# Patient Record
Sex: Male | Born: 1965 | Race: White | Hispanic: No | Marital: Married | Smoking: Never smoker
Health system: Southern US, Community
[De-identification: ages and names within clinical notes are randomized; demographics above are authoritative.]

## PROBLEM LIST (undated history)

## (undated) DIAGNOSIS — F419 Anxiety disorder, unspecified: Secondary | ICD-10-CM

## (undated) DIAGNOSIS — F32A Depression, unspecified: Secondary | ICD-10-CM

## (undated) DIAGNOSIS — G459 Transient cerebral ischemic attack, unspecified: Secondary | ICD-10-CM

## (undated) DIAGNOSIS — F524 Premature ejaculation: Secondary | ICD-10-CM

## (undated) DIAGNOSIS — K219 Gastro-esophageal reflux disease without esophagitis: Secondary | ICD-10-CM

## (undated) DIAGNOSIS — E291 Testicular hypofunction: Secondary | ICD-10-CM

## (undated) DIAGNOSIS — N529 Male erectile dysfunction, unspecified: Secondary | ICD-10-CM

## (undated) DIAGNOSIS — I639 Cerebral infarction, unspecified: Secondary | ICD-10-CM

## (undated) DIAGNOSIS — F329 Major depressive disorder, single episode, unspecified: Secondary | ICD-10-CM

## (undated) HISTORY — DX: Major depressive disorder, single episode, unspecified: F32.9

## (undated) HISTORY — DX: Male erectile dysfunction, unspecified: N52.9

## (undated) HISTORY — DX: Anxiety disorder, unspecified: F41.9

## (undated) HISTORY — DX: Depression, unspecified: F32.A

## (undated) HISTORY — DX: Premature ejaculation: F52.4

## (undated) HISTORY — DX: Testicular hypofunction: E29.1

## (undated) HISTORY — DX: Cerebral infarction, unspecified: I63.9

## (undated) HISTORY — DX: Gastro-esophageal reflux disease without esophagitis: K21.9

## (undated) HISTORY — PX: VASECTOMY: SHX75

---

## 2002-03-19 ENCOUNTER — Encounter: Payer: Self-pay | Admitting: Emergency Medicine

## 2002-03-19 ENCOUNTER — Emergency Department (HOSPITAL_COMMUNITY): Admission: EM | Admit: 2002-03-19 | Discharge: 2002-03-19 | Payer: Self-pay | Admitting: Emergency Medicine

## 2002-09-08 ENCOUNTER — Encounter: Admission: RE | Admit: 2002-09-08 | Discharge: 2002-09-08 | Payer: Self-pay | Admitting: Emergency Medicine

## 2002-09-08 ENCOUNTER — Encounter: Payer: Self-pay | Admitting: Emergency Medicine

## 2005-02-10 ENCOUNTER — Encounter: Admission: RE | Admit: 2005-02-10 | Discharge: 2005-02-10 | Payer: Self-pay | Admitting: Emergency Medicine

## 2006-03-13 DIAGNOSIS — I639 Cerebral infarction, unspecified: Secondary | ICD-10-CM

## 2006-03-13 HISTORY — DX: Cerebral infarction, unspecified: I63.9

## 2006-10-11 ENCOUNTER — Inpatient Hospital Stay (HOSPITAL_COMMUNITY): Admission: EM | Admit: 2006-10-11 | Discharge: 2006-10-12 | Payer: Self-pay | Admitting: Emergency Medicine

## 2006-10-12 ENCOUNTER — Ambulatory Visit: Payer: Self-pay | Admitting: Vascular Surgery

## 2006-10-12 ENCOUNTER — Encounter (INDEPENDENT_AMBULATORY_CARE_PROVIDER_SITE_OTHER): Payer: Self-pay | Admitting: Internal Medicine

## 2007-03-13 ENCOUNTER — Ambulatory Visit (HOSPITAL_COMMUNITY): Admission: RE | Admit: 2007-03-13 | Discharge: 2007-03-13 | Payer: Self-pay | Admitting: Cardiovascular Disease

## 2007-03-13 ENCOUNTER — Ambulatory Visit: Payer: Self-pay | Admitting: Cardiovascular Disease

## 2009-07-09 ENCOUNTER — Emergency Department (HOSPITAL_COMMUNITY): Admission: EM | Admit: 2009-07-09 | Discharge: 2009-07-09 | Payer: Self-pay | Admitting: Emergency Medicine

## 2009-09-28 ENCOUNTER — Ambulatory Visit (HOSPITAL_COMMUNITY): Admission: RE | Admit: 2009-09-28 | Discharge: 2009-09-28 | Payer: Self-pay | Admitting: Gastroenterology

## 2010-07-26 NOTE — H&P (Signed)
NAMECAGE, GUPTON                 ACCOUNT NO.:  [id number]   MEDICAL RECORD NO.:  [id number]          PATIENT TYPE:  INP   LOCATION:  6703                         FACILITY:  [facility]   PHYSICIAN:  Lonia Blood, M.D.       DATE OF BIRTH:  05/17/1965   DATE OF ADMISSION:  10/11/2006  DATE OF DISCHARGE:                              HISTORY & PHYSICAL   PRIMARY CARE PHYSICIAN:  Dr. Leslee Home   CHIEF COMPLAINT:  Could not talk.   HISTORY OF PRESENT ILLNESS:  Mr. Mcadory is a 45 year old gentleman  without any significant past medical history who presents to [facility]  emergency room via ambulance after he had an acute onset episode of  difficulties understanding words and then speaking them properly.  He  was also examined by his wife who is a nurse who felt that the patient  was having right facial droop.  The patient denies any focal weakness,  but he reports some right upper extremity tingling and some left leg  tingling.  The patient denies any vertigo.  The patient denies any  double vision.  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  The patient has no prior episodes similar to  this.   PAST MEDICAL HISTORY:  1. Gastroesophageal reflux disease.  2. Hiatal hernia.   HOME MEDICATIONS:  Zegerid 40 mg daily.   SOCIAL HISTORY:  The patient is married, has two twins, does not smoke  cigarettes.  Does not drink alcohol.  Does not use drugs.  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   FAMILY HISTORY:  The patient's mother is alive age 11, healthy.  The  patient's father is age 63, healthy, alive.  Grandmother had a stroke in  her 59s.  He has a sister who is alive and healthy.   REVIEW OF SYSTEMS:  As per HPI.  The patient denies any chest pain.  Denies nausea, vomiting, abdominal pain.  Denies fever, chills.  Denies  photophobia, phonophobia.  Reports that he was found to have traces of  blood in his stool, and he is in process getting endoscopy/colonoscopy.  Also, the patient reports some mild headache because he has not eaten  the whole day.   PHYSICAL EXAMINATION UPON ADMISSION:  VITAL SIGNS:  Temperature 97.7,  blood pressure 139/76, pulse 66, respirations 20, saturating 98% on room  air.  GENERAL APPEARANCE:  Well-developed, muscular gentleman in no acute  distress, sitting on stretcher.  Alert and oriented to place, person and  time.  HEENT:  Normocephalic, atraumatic.  Eyes pupils equal, round react and  accommodation.  Extraocular movements intact.  Throat clear.  Conjunctivae pink.  Sclerae anicteric.  NECK: Supple.  No JVD, carotid bruits.  CHEST: Clear to auscultation without wheeze, crackles.  HEART:  Regular rate and rhythm without murmurs, rubs or gallop.  ABDOMEN:  Soft, nontender, nondistended, bowel sounds are present.  EXTREMITIES:  Lower Extremities have no edema.  SKIN:  Warm, dry without any suspicious rashes.  NEUROLOGICAL:  Cranial nerves II-XII intact.  Cerebellar test is intact.  Deep tendon reflexes are +1 symmetric.  Sensation is intact all four  extremities.  Strength 5/5 in all four extremities.  Repetition is  intact.  Speech is intact.  Recall of three object is intact.  The  patient knows the president, vice president, knows date and time,  location.   LABORATORY VALUES ON ADMISSION:  Head CT is within normal limits without  any signs of bleed.  CBC shows white blood cell count 10,000, hemoglobin  13, MCV 76 and platelet 272.  PT is 14, INR is 1.1, PTT 28.  Chest x-ray  shows no acute disease.   ASSESSMENT/PLAN:  Transient ischemic attack.  The patient's symptoms are  highly worrisome for a transient ischemic attack.  Unsure about the  exact etiology of this transient ischemic attack, but it is sure seems  that neurological deficits have resolved completely.  The patient has  been working in the extremely hot weather, so I will not be surprised if  he has a degree dehydration. My plan is to admit Mr.  Holtsclaw in the  hospital and do a full evaluation including MRI of the brain, MRA of the  cranial vessels as well as the neck vessels.  The patient will have a  transthoracic echocardiogram.  The patient will have neurological checks  frequently.  We will start him on aspirin for now and place him on  intravenous fluids.  The basic metabolic profile will be checked as  well.      Lonia Blood, M.D.  Electronically Signed     SL/MEDQ  D:  10/11/2006  T:  10/12/2006  Job:  [id number]   cc:   Reuben Likes, M.D.

## 2010-07-26 NOTE — Assessment & Plan Note (Signed)
[facility]   NAME:  Zachary Massey, Zachary Massey                 ACCOUNT NO.:  [id number]   MEDICAL RECORD NO.:  [id number]      DATE OF BIRTH:  [birth year]   PHYSICIAN:  Noralyn Pick. Eden Emms, MD, [facility]    VISIT DATE:                                   OFFICE VISIT   PROCEDURE:  TEE.   INDICATIONS:  A 45 year old patient with question TIA.   PROCEDURE:  The patient was sedated with 100 mcg of fentanyl and 6 mg of  Versed using digital technique and Omniplane probes advanced in the  distal esophagus without incident. Transgastric imaging revealed normal  left ventricular cavity size and function. The EF with 60%.  There was  no mural thrombus. Mitral aortic and tricuspid valves were structurally  normal. The left atrium and right-sided cardiac chambers were normal.  There was no ASD or VSD. There was a prominent eustachian valve. Bubble  study was negative for right-to-left shunt. Interrogation of the atrial  septum by color and 2-D showed no ASD. Imaging of the left atrial  appendage was normal with no spontaneous contrast or thrombus. Imaging  of the aorta showed no debris.   FINAL IMPRESSION:  1. Normal transesophageal echocardiogram.  2. No source of embolus.  3. Negative bubble study with no right-to-left shunt.  4. Normal cardiac valves.  5. Normal left jugular function, EF 60%.   The patient tolerated the procedure well.      Noralyn Pick. Eden Emms, MD, [facility]  Electronically Signed     PCN/MEDQ  D:  03/13/2007  T:  03/13/2007  Job:  [id number]   cc:   Pramod P. Pearlean Brownie, MD

## 2010-07-29 NOTE — Discharge Summary (Signed)
NAMESIMUEL, Zachary Massey                 ACCOUNT NO.:  [id number]   MEDICAL RECORD NO.:  [id number]          PATIENT TYPE:  INP   LOCATION:  6703                         FACILITY:  [facility]   PHYSICIAN:  Lonia Blood, M.D.       DATE OF BIRTH:  [birth year]   DATE OF ADMISSION:  10/11/2006  DATE OF DISCHARGE:  10/12/2006                               DISCHARGE SUMMARY   THE PATIENT'S PRIMARY CARE PHYSICIAN:  Reuben Likes, M.D.   DISCHARGE DIAGNOSES:  1. Transient ischemic attack.  2. Dehydration, resolved.  3. Hypertriglyceridemia.  4. Gastroesophageal reflux disease and hiatal hernia.   DISCHARGE MEDICATIONS:  1. Aspirin 325 mg daily.  2. Zegerid twice a day.  3. Lovaza four capsules by mouth daily.   CONDITION ON DISCHARGE:  [name] was discharged in good condition.  At the time of discharge the patient was instructed to follow up with  his primary care physician, Dr. Leslee Home, as needed.  At the time of  the discharge the patient was neurologically intact.   PROCEDURES DURING THIS ADMISSION:  1. October 11, 2006, a computerized tomography scan of the head.      Findings:  Within normal limits.  2. October 11, 2006, MRI of the brain.  Findings:  Tiny nonspecific      cerebrospinal fluid intensity in the left periatrial white matter      of questionable significance.  No acute infarct or abnormal      intracranial enhancing lesions.  3. MRA of the head.  Findings of slight irregularity of medium-sized      vessels and branch vessels, which may represent limitation of the      present exam.  4. MRA of the neck:  Findings of no hemodynamically significant      stenosis.  5. October 12, 2006, carotid ultrasound.  Findings of no significant ICA      stenosis.  6. Transthoracic echocardiogram.  Findings of ejection fraction of 55-      65%, no diastolic dysfunction.  No clear PFO.   CONSULTATION:  No consultations were obtained.   HISTORY AND PHYSICAL:  Please refer to a dictated H&P  done by Dr. Lavera Guise  on October 11, 2006.   HOSPITAL COURSE:  Problem:  TRANSIENT ISCHEMIC ATTACKS.  [name] was admitted after he  had experienced transient global aphasia.  His episode was in the  setting of prolonged physical effort under extreme heat.  Given the  worrisome neurological history [name] was admitted for a complete  workup, which included MRI of the brain, MRA of the cranial vessels and  of the neck as well as a transthoracic echocardiogram.  All these  studies were essentially normal and did not show any findings that could  explain the patient's episode.  He also received generous intravenous  fluids and he did not have any recurrent events.  He was advised to  start taking aspirin.  The patient's  fasting lipid panel was measured and his triglycerides were measured at  396.  The patient was educated about the proper  diet, which includes  avoidance of highly-refined carbohydrates, proper exercise, and he was  started on Lovaza.      Lonia Blood, M.D.  Electronically Signed     SL/MEDQ  D:  10/14/2006  T:  10/15/2006  Job:  [id number]

## 2010-12-26 LAB — COMPREHENSIVE METABOLIC PANEL
ALT: 14
Alkaline Phosphatase: 81
BUN: 8
CO2: 26
Chloride: 103
GFR calc non Af Amer: >60
Glucose, Bld: 83
Potassium: 4.5
Sodium: 136
Total Bilirubin: 0.9
Total Protein: 6.9

## 2010-12-26 LAB — HOMOCYSTEINE: Homocysteine: 6.2

## 2010-12-26 LAB — DIFFERENTIAL
Basophils Absolute: 0
Basophils Absolute: 0.1
Basophils Relative: 0
Basophils Relative: 1
Eosinophils Absolute: 0.1
Eosinophils Absolute: 0.1
Eosinophils Relative: 1
Eosinophils Relative: 1
Lymphocytes Relative: 27
Lymphocytes Relative: 35
Lymphs Abs: 2.7
Lymphs Abs: 2.7
Monocytes Absolute: 0.5
Monocytes Absolute: 0.5
Monocytes Relative: 5
Monocytes Relative: 7
Neutro Abs: 4.5
Neutro Abs: 6.8
Neutrophils Relative %: 57
Neutrophils Relative %: 68

## 2010-12-26 LAB — ETHANOL: Alcohol, Ethyl (B): <5

## 2010-12-26 LAB — RAPID URINE DRUG SCREEN, HOSP PERFORMED
Amphetamines: NOT DETECTED
Barbiturates: NOT DETECTED
Benzodiazepines: NOT DETECTED
Cocaine: NOT DETECTED
Opiates: NOT DETECTED
Tetrahydrocannabinol: NOT DETECTED

## 2010-12-26 LAB — CBC
HCT: 37 — ABNORMAL LOW
HCT: 40.3
Hemoglobin: 11.9 — ABNORMAL LOW
Hemoglobin: 13.4
MCHC: 32.1
MCHC: 33.2
MCV: 76 — ABNORMAL LOW
MCV: 83
Platelets: 237
Platelets: 272
RBC: 4.46
RBC: 5.3
RDW: 14.1 — ABNORMAL HIGH
RDW: 14.5 — ABNORMAL HIGH
WBC: 10.1
WBC: 7.8

## 2010-12-26 LAB — LIPID PANEL
Cholesterol: 167
LDL Cholesterol: 63

## 2010-12-26 LAB — PROTIME-INR
INR: 0.9
INR: 1.1
Prothrombin Time: 12
Prothrombin Time: 14.1

## 2010-12-26 LAB — COMPREHENSIVE METABOLIC PANEL WITH GFR
AST: 27
Albumin: 4
Calcium: 8.9
Creatinine, Ser: 0.93
GFR calc Af Amer: >60

## 2010-12-26 LAB — CK TOTAL AND CKMB (NOT AT ARMC)
CK, MB: 1.5
Relative Index: 1.1
Total CK: 135

## 2010-12-26 LAB — TROPONIN I: Troponin I: 0.02

## 2010-12-26 LAB — APTT
aPTT: 25
aPTT: 28

## 2011-05-15 ENCOUNTER — Other Ambulatory Visit: Payer: Self-pay | Admitting: Family Medicine

## 2011-05-15 MED ORDER — CLOPIDOGREL BISULFATE 75 MG PO TABS: 75.0000 mg | ORAL_TABLET | Freq: Every day | ORAL | Status: DC

## 2011-05-15 MED ORDER — SERTRALINE HCL 100 MG PO TABS: 100.0000 mg | ORAL_TABLET | Freq: Every day | ORAL | Status: DC

## 2011-05-25 ENCOUNTER — Ambulatory Visit (INDEPENDENT_AMBULATORY_CARE_PROVIDER_SITE_OTHER): Payer: 59 | Admitting: Family Medicine

## 2011-05-25 VITALS — BP 131/81 | HR 53 | Temp 98.7°F | Resp 16 | Ht 67.5 in | Wt 182.0 lb

## 2011-05-25 DIAGNOSIS — G459 Transient cerebral ischemic attack, unspecified: Secondary | ICD-10-CM

## 2011-05-25 DIAGNOSIS — F411 Generalized anxiety disorder: Secondary | ICD-10-CM

## 2011-05-25 DIAGNOSIS — F419 Anxiety disorder, unspecified: Secondary | ICD-10-CM

## 2011-05-25 DIAGNOSIS — K219 Gastro-esophageal reflux disease without esophagitis: Secondary | ICD-10-CM

## 2011-05-25 DIAGNOSIS — Z0000 Encounter for general adult medical examination without abnormal findings: Secondary | ICD-10-CM

## 2011-05-25 LAB — COMPREHENSIVE METABOLIC PANEL
Albumin: 4.7 g/dL (ref 3.5–5.2)
Alkaline Phosphatase: 88 U/L (ref 39–117)
BUN: 14 mg/dL (ref 6–23)
Creat: 0.84 mg/dL (ref 0.50–1.35)
Glucose, Bld: 94 mg/dL (ref 70–99)
Potassium: 4.4 mEq/L (ref 3.5–5.3)

## 2011-05-25 LAB — LIPID PANEL
HDL: 33 mg/dL — ABNORMAL LOW (ref >39)
LDL Cholesterol: 108 mg/dL — ABNORMAL HIGH (ref 0–99)
Total CHOL/HDL Ratio: 5.8 Ratio
Triglycerides: 258 mg/dL — ABNORMAL HIGH (ref <150)

## 2011-05-25 MED ORDER — CLOPIDOGREL BISULFATE 75 MG PO TABS: 75.0000 mg | ORAL_TABLET | Freq: Every day | ORAL | Status: DC

## 2011-05-25 MED ORDER — SERTRALINE HCL 100 MG PO TABS: 100.0000 mg | ORAL_TABLET | Freq: Every day | ORAL | Status: DC

## 2011-05-25 NOTE — Progress Notes (Signed)
Color Test: 6/6 Horizontal Field: R: 85  L: 85 Whisper Test: R: 47ft.  L:  30ft.  UA Dip: Sp Gr: 1.025 Protein: 0 Blood: 0 Sugar: 0

## 2011-05-25 NOTE — Progress Notes (Signed)
  Subjective:    Patient ID: Zachary Massey, male    DOB: [birth year], 46 y.o.   MRN: [id number]  HPI Zachary Massey is a 46 y.o. male  CPE with DOT pe.    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 Last eval year ago. No restrictions.   Hx GERD, with short segment Barrett's esophagus, followed by Dr Matthias Hughs, on Protonix QD. On zoloft for premature ejaculation, and hx of anxiety  And depression.  Denies current depression or SI.  Chiropodist, backup driver.  Review of Systems See 13 point ROS on scanned PHS - specifically no depression, anxiety well controlled on current dose of zoloft, no hematuria or other easy bruising.  No recent new neuro sx's - no weakness or slurred speech.      Objective:   Physical Exam  Constitutional: He is oriented to person, place, and time. He appears well-developed and well-nourished.  HENT:  Head: Normocephalic and atraumatic.  Right Ear: External ear normal.  Left Ear: External ear normal.  Nose: Nose normal.  Mouth/Throat: Oropharynx is clear and moist.  Eyes: Conjunctivae and EOM are normal. Pupils are equal, round, and reactive to light.  Neck: Normal range of motion. No thyromegaly present.  Cardiovascular: Normal rate, regular rhythm, normal heart sounds and intact distal pulses.   No murmur heard. Pulmonary/Chest: Effort normal and breath sounds normal.  Abdominal: Soft. Bowel sounds are normal. He exhibits no mass. There is no tenderness. Hernia confirmed negative in the right inguinal area and confirmed negative in the left inguinal area.  Genitourinary: Testes normal and penis normal.  Musculoskeletal: Normal range of motion. He exhibits no edema and no tenderness.  Lymphadenopathy:    He has no cervical adenopathy.  Neurological: He is alert and oriented to person, place, and time. He has normal strength. No sensory deficit. Coordination and gait normal.  Skin: Skin is warm and dry.    Psychiatric: He has a normal mood and affect. His speech is normal and behavior is normal. Judgment normal. Cognition and memory are normal.        Assessment & Plan:  Zachary Massey is a 46 y.o. male 1. Annual physical exam  Comprehensive metabolic panel, Lipid panel, CBC  2. GERD (gastroesophageal reflux disease)    3. TIA (transient ischemic attack)    4. Anxiety     No recent new neuro sx's.  Anxiety and premature ejaculation under control with current meds.  No new side effects with meds.  Vit D and other labs pending from GI.  check CBC, CMP, lipid panel, refilled Plavix and Zoloft for 6 months, then follow up with Dr. Perrin Maltese. 1 year DOT card.

## 2011-05-26 LAB — CBC
HCT: 44.2 % (ref 39.0–52.0)
MCHC: 32.6 g/dL (ref 30.0–36.0)
MCV: 76.7 fL — ABNORMAL LOW (ref 78.0–100.0)
RDW: 14.8 % (ref 11.5–15.5)

## 2011-12-01 ENCOUNTER — Encounter: Payer: Self-pay | Admitting: Family Medicine

## 2011-12-01 ENCOUNTER — Ambulatory Visit (INDEPENDENT_AMBULATORY_CARE_PROVIDER_SITE_OTHER): Payer: 59 | Admitting: Family Medicine

## 2011-12-01 VITALS — BP 124/83 | HR 75 | Temp 97.9°F | Resp 16 | Ht 68.5 in | Wt 192.8 lb

## 2011-12-01 DIAGNOSIS — R5383 Other fatigue: Secondary | ICD-10-CM

## 2011-12-01 DIAGNOSIS — Z8673 Personal history of transient ischemic attack (TIA), and cerebral infarction without residual deficits: Secondary | ICD-10-CM | POA: Insufficient documentation

## 2011-12-01 DIAGNOSIS — Z862 Personal history of diseases of the blood and blood-forming organs and certain disorders involving the immune mechanism: Secondary | ICD-10-CM

## 2011-12-01 DIAGNOSIS — R5381 Other malaise: Secondary | ICD-10-CM

## 2011-12-01 LAB — CBC WITH DIFFERENTIAL/PLATELET
Basophils Absolute: 0 10*3/uL (ref 0.0–0.1)
Basophils Relative: 0 % (ref 0–1)
Eosinophils Absolute: 0.3 10*3/uL (ref 0.0–0.7)
MCH: 25.4 pg — ABNORMAL LOW (ref 26.0–34.0)
MCHC: 34.3 g/dL (ref 30.0–36.0)
Neutrophils Relative %: 70 % (ref 43–77)
Platelets: 284 10*3/uL (ref 150–400)

## 2011-12-01 NOTE — Progress Notes (Signed)
S: This 46 y.o. Cauc male has had mild fatigue and low energy for several weeks. His wife, who is a CMA, suggested he be checked for low testosterone. His medical hx is significant for TIA when he was in his late 30s. He takes Plavix which was prescribed by Dr. Pearlean Brownie ; he has been released to annual follow-up with that neurologist. He has decreased libido and minimally decreased muscle strength. He has gained weight. He lacks energy to keep up with his 44 y.o. twins (boy and girl). He Sleeps ~ 6 hours most nights.  ROS: No diaphoresis, fever, vision disturbances, cardiac symptoms, SOB or apnea, GI disturbance, urinary problems, testicular problems, HA, tremor or syncope.  O:  Filed Vitals:   12/01/11 1105  BP: 124/83  Pulse: 75  Temp: 97.9 F (36.6 C)  Resp: 16   GEN: In NAD; WN,WD. HENT: [place]/AT; EOMI, conj/scl clear. COR: RRR; No m,g,r. LUNGS: CTA, no wheezes NEURO: A&O x 3; CNs intact, DTRs 2+/=; otherwise nonfocal.  A/P: 1. Fatigue  Testosterone, free, TSH, Vitamin D, 25-hydroxy, Testosterone  2. History of anemia  CBC with Differential

## 2011-12-03 ENCOUNTER — Encounter: Payer: Self-pay | Admitting: Family Medicine

## 2011-12-03 DIAGNOSIS — K219 Gastro-esophageal reflux disease without esophagitis: Secondary | ICD-10-CM | POA: Insufficient documentation

## 2011-12-03 NOTE — Progress Notes (Signed)
Quick Note:  Please call pt and advise that the following labs are abnormal... Testosterone level is below normal. Please schedule a follow-up appointment to discuss treatment options.   Copy to pt. ______

## 2011-12-04 LAB — TESTOSTERONE, FREE, TOTAL, SHBG
Testosterone-% Free: 2.7 % (ref 1.6–2.9)
Testosterone: 185.07 ng/dL — ABNORMAL LOW (ref 300–890)

## 2011-12-08 ENCOUNTER — Ambulatory Visit: Payer: 59 | Admitting: Family Medicine

## 2011-12-12 ENCOUNTER — Ambulatory Visit (INDEPENDENT_AMBULATORY_CARE_PROVIDER_SITE_OTHER): Payer: 59 | Admitting: Family Medicine

## 2011-12-12 ENCOUNTER — Encounter: Payer: Self-pay | Admitting: Family Medicine

## 2011-12-12 VITALS — BP 131/79 | HR 65 | Temp 98.2°F | Resp 16 | Ht 68.0 in | Wt 190.0 lb

## 2011-12-12 DIAGNOSIS — E291 Testicular hypofunction: Secondary | ICD-10-CM

## 2011-12-12 MED ORDER — TESTOSTERONE 50 MG/5GM (1%) TD GEL: 5.0000 g | Freq: Every day | TRANSDERMAL | Status: DC

## 2011-12-12 NOTE — Patient Instructions (Signed)
Androgel is being prescribed to treat low testosterone level; follow the package instructions- apply to clean dry skin on shoulders or abdomen once daily in the AM and wash hands after applying. Keep medication away from male and children.

## 2011-12-12 NOTE — Progress Notes (Signed)
S; This 46 y.o. Cauc male returns to discuss low testosterone level and treatment options. He is not interested injections. He would like to try topical medication. His overall health status has not changed since last visit.  ROS: negative for unexplained weight loss, CP or tightness, diaphoresis, palpitations, SOB, cough, GU symptoms, dizziness or lightheadedness, weakness, syncope.   O:  Filed Vitals:   12/12/11 1604  BP: 131/79  Pulse: 65  Temp: 98.2 F (36.8 C)  Resp: 16   GEN: In NAD; WN,WD. HENT: [place]/AT; EOMI w/o icteric sclerae. COR: RRR. LUNGS: Normal resp rate and effort. NEURO: A&O x 3; CNs intact, otherwise unremarkable.    Results for orders placed in visit on 12/01/11  CBC WITH DIFFERENTIAL      Component Value Range   WBC 9.8  4.0 - 10.5 K/uL   RBC 5.52  4.22 - 5.81 MIL/uL   Hemoglobin 14.0  13.0 - 17.0 g/dL   HCT 91.4  78.2 - 95.6 %   MCV 73.9 (*) 78.0 - 100.0 fL   MCH 25.4 (*) 26.0 - 34.0 pg   MCHC 34.3  30.0 - 36.0 g/dL   RDW 21.3  08.6 - 57.8 %   Platelets 284  150 - 400 K/uL   Neutrophils Relative 70  43 - 77 %   Neutro Abs 6.8  1.7 - 7.7 K/uL   Lymphocytes Relative 22  12 - 46 %   Lymphs Abs 2.2  0.7 - 4.0 K/uL   Monocytes Relative 5  3 - 12 %   Monocytes Absolute 0.5  0.1 - 1.0 K/uL   Eosinophils Relative 3  0 - 5 %   Eosinophils Absolute 0.3  0.0 - 0.7 K/uL   Basophils Relative 0  0 - 1 %   Basophils Absolute 0.0  0.0 - 0.1 K/uL   Smear Review Criteria for review not met    TSH      Component Value Range   TSH 3.084  0.350 - 4.500 uIU/mL  VITAMIN D 25 HYDROXY      Component Value Range   Vit D, 25-Hydroxy 44  30 - 89 ng/mL  TESTOSTERONE, FREE, TOTAL      Component Value Range   Testosterone 185.07 (*) 300 - 890 ng/dL   Sex Hormone Binding 16  13 - 71 nmol/L   Testosterone, Free 49.9  47.0 - 244.0 pg/mL   Testosterone-% Freee. 2.7  1.6 - 2.9 %     A/P: 1. Hypogonadism male       Pt wants to try Androgel  5 gram dose once a day; he is  instructed to read all package information before and while using this medication.

## 2011-12-25 ENCOUNTER — Other Ambulatory Visit: Payer: Self-pay | Admitting: Family Medicine

## 2011-12-25 NOTE — Telephone Encounter (Signed)
Dr. Audria Nine,  I see this patient came in recently to see you for hypogonadism and we had asked him to come in to discuss his Plavix. Based on your recent office visit do you feel comfortable refilling his Plavix?   Ryan

## 2011-12-26 ENCOUNTER — Telehealth: Payer: Self-pay | Admitting: Family Medicine

## 2011-12-26 NOTE — Telephone Encounter (Signed)
I am familiar with this pt's hx and have authorized refills for generic Plavix and Sertraline.

## 2012-01-03 ENCOUNTER — Encounter: Payer: Self-pay | Admitting: Internal Medicine

## 2012-04-02 ENCOUNTER — Ambulatory Visit: Payer: 59 | Admitting: Family Medicine

## 2012-04-09 ENCOUNTER — Ambulatory Visit (INDEPENDENT_AMBULATORY_CARE_PROVIDER_SITE_OTHER): Payer: 59 | Admitting: Family Medicine

## 2012-04-09 VITALS — BP 127/74 | HR 61 | Temp 98.1°F | Resp 16 | Ht 67.0 in | Wt 192.0 lb

## 2012-04-09 DIAGNOSIS — Q809 Congenital ichthyosis, unspecified: Secondary | ICD-10-CM | POA: Insufficient documentation

## 2012-04-09 DIAGNOSIS — Z0000 Encounter for general adult medical examination without abnormal findings: Secondary | ICD-10-CM

## 2012-04-09 LAB — POCT URINALYSIS DIPSTICK
Bilirubin, UA: NEGATIVE
Blood, UA: NEGATIVE
Glucose, UA: NEGATIVE
Ketones, UA: NEGATIVE
Leukocytes, UA: NEGATIVE
Nitrite, UA: NEGATIVE
Protein, UA: NEGATIVE
Spec Grav, UA: 1.025
Urobilinogen, UA: 0.2
pH, UA: 5.5

## 2012-04-09 LAB — POCT CBC
Granulocyte percent: 54.8 %G (ref 37–80)
HCT, POC: 41.9 % — AB (ref 43.5–53.7)
Hemoglobin: 13.6 g/dL — AB (ref 14.1–18.1)
Lymph, poc: 3.2 (ref 0.6–3.4)
MCH, POC: 25.4 pg — AB (ref 27–31.2)
MCHC: 32.5 g/dL (ref 31.8–35.4)
MCV: 78.1 fL — AB (ref 80–97)
MID (cbc): 0.6 (ref 0–0.9)
MPV: 8.9 fL (ref 0–99.8)
POC Granulocyte: 4.6 (ref 2–6.9)
POC LYMPH PERCENT: 38 %L (ref 10–50)
POC MID %: 7.2 %M (ref 0–12)
Platelet Count, POC: 306 10*3/uL (ref 142–424)
RBC: 5.36 M/uL (ref 4.69–6.13)
RDW, POC: 15.5 %
WBC: 8.4 10*3/uL (ref 4.6–10.2)

## 2012-04-09 LAB — COMPREHENSIVE METABOLIC PANEL
ALT: 13 U/L (ref 0–53)
AST: 19 U/L (ref 0–37)
Albumin: 4.7 g/dL (ref 3.5–5.2)
Alkaline Phosphatase: 93 U/L (ref 39–117)
BUN: 18 mg/dL (ref 6–23)
CO2: 27 mEq/L (ref 19–32)
Calcium: 9.7 mg/dL (ref 8.4–10.5)
Chloride: 102 mEq/L (ref 96–112)
Creat: 0.99 mg/dL (ref 0.50–1.35)
Glucose, Bld: 100 mg/dL — ABNORMAL HIGH (ref 70–99)
Potassium: 4.6 mEq/L (ref 3.5–5.3)
Sodium: 138 mEq/L (ref 135–145)
Total Bilirubin: 0.5 mg/dL (ref 0.3–1.2)
Total Protein: 7.3 g/dL (ref 6.0–8.3)

## 2012-04-09 LAB — LIPID PANEL
Cholesterol: 210 mg/dL — ABNORMAL HIGH (ref 0–200)
HDL: 27 mg/dL — ABNORMAL LOW (ref >39)
Total CHOL/HDL Ratio: 7.8 Ratio
Triglycerides: 637 mg/dL — ABNORMAL HIGH (ref <150)

## 2012-04-09 NOTE — Progress Notes (Signed)
Patient ID: Zachary Massey MRN: [id number], DOB: 25-Nov-1965 47 y.o. Date of Encounter: 04/09/2012, 8:39 AM  Primary Physician: Tally Due, MD  Chief Complaint: Physical (CPE)  HPI: 47 y.o. y/o male with history noted below here for CPE.  Doing well.  Currently working for [employer].  Married No issues/complaints.  Not exercising regularly which I encouraged him to do.  Patient has a history of a TIA where he became a phasic temporarily 6 years ago. He's doing time is working very hard and was under great stress. Was some thought given to the fact that it was a heat stroke. The entire episode involved only loss of speech and was only a few minutes long. His subsequent work workup for him cardiac and other neurological problems was all negative. His no family history of stroke.  Patient also takes pantoprazole for GERD and sertraline for premature ejaculation.  Review of Systems: Consitutional: No fever, chills, fatigue, night sweats, lymphadenopathy, or weight changes. Eyes: No visual changes, eye redness, or discharge. ENT/Mouth: Ears: No otalgia, tinnitus, hearing loss, discharge. Nose: No congestion, rhinorrhea, sinus pain, or epistaxis. Throat: No sore throat, post nasal drip, or teeth pain. Cardiovascular: No CP, palpitations, diaphoresis, DOE, edema, orthopnea, PND. Respiratory: No cough, hemoptysis, SOB, or wheezing. Gastrointestinal: No anorexia, dysphagia, reflux, pain, nausea, vomiting, hematemesis, diarrhea, constipation, BRBPR, or melena. Genitourinary: No dysuria, frequency, urgency, hematuria, incontinence, nocturia, decreased urinary stream, discharge, impotence, or testicular pain/masses. Musculoskeletal: No decreased ROM, myalgias, stiffness, joint swelling, or weakness. Skin: No rash, erythema, lesion changes, pain, warmth, jaundice, or pruritis. Neurological: No headache, dizziness, syncope, seizures, tremors, memory loss, coordination problems, or  paresthesias. Psychological: No anxiety, depression, hallucinations, SI/HI. Endocrine: No fatigue, polydipsia, polyphagia, polyuria, or known diabetes. All other systems were reviewed and are otherwise negative.  Past Medical History  Diagnosis Date  . GERD (gastroesophageal reflux disease)   . Stroke      History reviewed. No pertinent past surgical history.  Home Meds:  Prior to Admission medications   Medication Sig Start Date End Date Taking? Authorizing Provider  clopidogrel (PLAVIX) 75 MG tablet TAKE 1 TABLET BY MOUTH ONCE DAILY **NEED OFFICE VISIT** 12/25/11  Yes [name], MD  pantoprazole (PROTONIX) 40 MG tablet Take 40 mg by mouth daily.   Yes Historical Provider, MD  sertraline (ZOLOFT) 100 MG tablet TAKE 1 TABLET BY MOUTH ONCE DAILY **NEED OFFICE VISIT** 12/25/11  Yes [name], MD  testosterone (ANDROGEL) 50 MG/5GM GEL Place 5 g onto the skin daily. 12/12/11  Yes [name], MD    Allergies: No Known Allergies  History   Social History  . Marital Status: Married    Spouse Name: N/A    Number of Children: N/A  . Years of Education: N/A   Occupational History  . Not on file.   Social History Main Topics  . Smoking status: Never Smoker   . Smokeless tobacco: Not on file  . Alcohol Use: Not on file  . Drug Use: Not on file  . Sexually Active: Not on file   Other Topics Concern  . Not on file   Social History Narrative  . No narrative on file    No family history on file.  Physical Exam: Blood pressure 127/74, pulse 61, temperature 98.1 F (36.7 C), temperature source Oral, resp. rate 16, height 5\' 7"  (1.702 m), weight 192 lb (87.091 kg), SpO2 98.00%.  General: Well developed, well nourished, in no acute distress. HEENT:  Normocephalic, atraumatic. Conjunctiva pink, sclera non-icteric. Pupils 2 mm constricting to 1 mm, round, regular, and equally reactive to light and accomodation. EOMI. Internal auditory canal clear. TMs with  good cone of light and without pathology. Nasal mucosa pink. Nares are without discharge. No sinus tenderness. Oral mucosa pink. Dentition okay. Pharynx without exudate.   Neck: Supple. Trachea midline. No thyromegaly. Full ROM. No lymphadenopathy. Lungs: Clear to auscultation bilaterally without wheezes, rales, or rhonchi. Breathing is of normal effort and unlabored. Cardiovascular: RRR with S1 S2. No murmurs, rubs, or gallops appreciated. Distal pulses 2+ symmetrically. No carotid or abdominal bruits Abdomen: Soft, non-tender, non-distended with normoactive bowel sounds. No hepatosplenomegaly or masses. No rebound/guarding. No CVA tenderness. Without hernias.   Genitourinary:  circumcised male. No penile lesions. Testes descended bilaterally, and smooth without tenderness or masses.  Musculoskeletal: Full range of motion and 5/5 strength throughout. Without swelling, atrophy, tenderness, crepitus, or warmth. Extremities without clubbing, cyanosis, or edema. Calves supple. Skin: Warm and moist without erythema, ecchymosis, wounds, or rash.  Diffuse ichthyosis rash. Neuro: A+Ox3. CN II-XII grossly intact. Moves all extremities spontaneously. Full sensation throughout. Normal gait. DTR 2+ throughout upper and lower extremities. Finger to nose intact. Psych:  Responds to questions appropriately with a normal affect.   Studies: CBC, CMET, Lipid UA:   Assessment/Plan:  47 y.o. y/o  male here for CPE Given lack of symptoms for 6 years, and the nature of his symptoms initially, adenopathy the patient is at great risk for stroke. Instead I believe he had a TIA related to heat exhaustion. At the same time, continue the Plavix and like a reasonable approach.  I agreed to have patient get his refills on prescriptions for the next year including all of his meds is currently taking. -  Signed, Elvina Sidle, MD 04/09/2012 8:39 AM

## 2012-04-16 ENCOUNTER — Encounter: Payer: Self-pay | Admitting: Family Medicine

## 2012-04-16 ENCOUNTER — Telehealth: Payer: Self-pay

## 2012-04-16 NOTE — Telephone Encounter (Signed)
Pt notified. See labs 

## 2012-04-16 NOTE — Telephone Encounter (Signed)
PATIENT CALLED AND STATED HE HAD A VOICE MESSAGE THAT HIS TEST RESULTS WERE IN FROM A PHYSICAL HE HAD HERE.  PLEASE CALL [phone]

## 2012-04-30 ENCOUNTER — Telehealth: Payer: Self-pay | Admitting: *Deleted

## 2012-04-30 MED ORDER — PANTOPRAZOLE SODIUM 40 MG PO TBEC: 80.0000 mg | DELAYED_RELEASE_TABLET | Freq: Every day | ORAL | Status: DC

## 2012-04-30 NOTE — Telephone Encounter (Signed)
[facility] requesting refill on protonix 40mg .  Also pt states that he is taking 2 tablets per day

## 2012-04-30 NOTE — Telephone Encounter (Signed)
Meds ordered this encounter  Medications  . pantoprazole (PROTONIX) 40 MG tablet    Sig: Take 2 tablets (80 mg total) by mouth daily.    Dispense:  60 tablet    Refill:  1    Order Specific Question:  Supervising Provider    Answer:  DOOLITTLE, ROBERT P [[id number]]

## 2012-04-30 NOTE — Telephone Encounter (Signed)
Pharmacy now requesting a 3 month supply.

## 2012-05-01 MED ORDER — PANTOPRAZOLE SODIUM 40 MG PO TBEC: 80.0000 mg | DELAYED_RELEASE_TABLET | Freq: Every day | ORAL | Status: DC

## 2012-05-01 NOTE — Telephone Encounter (Signed)
Sent to pharmacy 

## 2012-07-01 ENCOUNTER — Other Ambulatory Visit: Payer: Self-pay | Admitting: Family Medicine

## 2012-09-07 ENCOUNTER — Encounter (HOSPITAL_COMMUNITY): Payer: Self-pay | Admitting: Emergency Medicine

## 2012-09-07 ENCOUNTER — Emergency Department (HOSPITAL_COMMUNITY)
Admission: EM | Admit: 2012-09-07 | Discharge: 2012-09-08 | Disposition: A | Discharge status: Home / Self Care | Payer: 59 | Attending: Emergency Medicine | Admitting: Emergency Medicine

## 2012-09-07 DIAGNOSIS — K219 Gastro-esophageal reflux disease without esophagitis: Secondary | ICD-10-CM | POA: Insufficient documentation

## 2012-09-07 DIAGNOSIS — Z8673 Personal history of transient ischemic attack (TIA), and cerebral infarction without residual deficits: Secondary | ICD-10-CM | POA: Insufficient documentation

## 2012-09-07 DIAGNOSIS — Z7902 Long term (current) use of antithrombotics/antiplatelets: Secondary | ICD-10-CM | POA: Insufficient documentation

## 2012-09-07 DIAGNOSIS — M7989 Other specified soft tissue disorders: Secondary | ICD-10-CM | POA: Insufficient documentation

## 2012-09-07 DIAGNOSIS — Z79899 Other long term (current) drug therapy: Secondary | ICD-10-CM | POA: Insufficient documentation

## 2012-09-07 HISTORY — DX: Transient cerebral ischemic attack, unspecified: G45.9

## 2012-09-07 NOTE — ED Notes (Signed)
PT. 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

## 2012-09-07 NOTE — ED Notes (Addendum)
Patient presents with c/o pain to the left lower leg.  Has been on a trip where he drove for 4 hours and then turned around and drove back 4 hours.  Left lower leg is warm to touch and slightly red.   Left leg measures 15" at the knee, 15 1/8" mid calf and 10 1/2" at the ankle.  Right leg measures 14 3/4" at the knee, 13 1/2" mid calf and 9 1/2" at the ankle. + pedal pulses bilaterally.

## 2012-09-08 ENCOUNTER — Ambulatory Visit (HOSPITAL_COMMUNITY)
Admission: RE | Admit: 2012-09-08 | Discharge: 2012-09-08 | Disposition: A | Discharge status: Home / Self Care | Payer: 59 | Source: Ambulatory Visit | Attending: Emergency Medicine | Admitting: Emergency Medicine

## 2012-09-08 DIAGNOSIS — M79609 Pain in unspecified limb: Secondary | ICD-10-CM

## 2012-09-08 DIAGNOSIS — M7989 Other specified soft tissue disorders: Secondary | ICD-10-CM

## 2012-09-08 LAB — CBC WITH DIFFERENTIAL/PLATELET
Basophils Absolute: 0.1 10*3/uL (ref 0.0–0.1)
Basophils Relative: 1 % (ref 0–1)
Eosinophils Absolute: 0.3 10*3/uL (ref 0.0–0.7)
MCH: 25.6 pg — ABNORMAL LOW (ref 26.0–34.0)
MCHC: 34 g/dL (ref 30.0–36.0)
Monocytes Relative: 8 % (ref 3–12)
Neutro Abs: 5.5 10*3/uL (ref 1.7–7.7)
Neutrophils Relative %: 54 % (ref 43–77)
Platelets: 244 10*3/uL (ref 150–400)
RDW: 15.1 % (ref 11.5–15.5)

## 2012-09-08 LAB — BASIC METABOLIC PANEL
BUN: 12 mg/dL (ref 6–23)
GFR calc Af Amer: >90 mL/min (ref >90)
GFR calc non Af Amer: >90 mL/min (ref >90)
Potassium: 3.4 mEq/L — ABNORMAL LOW (ref 3.5–5.1)

## 2012-09-08 LAB — PROTIME-INR: Prothrombin Time: 14.1 seconds (ref 11.6–15.2)

## 2012-09-08 MED ORDER — ENOXAPARIN SODIUM 100 MG/ML ~~LOC~~ SOLN
90.0000 mg | Freq: Once | SUBCUTANEOUS | Status: AC
  Administered 2012-09-08: 90 mg via SUBCUTANEOUS
  Filled 2012-09-08: qty 1

## 2012-09-08 MED ORDER — CEPHALEXIN 500 MG PO CAPS: 500.0000 mg | ORAL_CAPSULE | Freq: Four times a day (QID) | ORAL | Status: DC

## 2012-09-08 NOTE — Progress Notes (Signed)
*  Preliminary Results* Left lower extremity venous duplex completed. Left lower extremity is negative for deep vein thrombosis. There is no evidence of left Baker's cyst. Patient was discharged and advised to follow discharge instructions from ED.  09/08/2012 10:08 AM Gertie Fey, RVT, RDCS, RDMS

## 2012-09-08 NOTE — ED Provider Notes (Addendum)
History    CSN: [id number] Arrival date & time 09/07/12  2246  First MD Initiated Contact with Patient 09/08/12 [phone]     Chief Complaint  Patient presents with  . Leg Swelling   (Consider location/radiation/quality/duration/timing/severity/associated sxs/prior Treatment) The history is provided by the patient.  47 year old male comes in with three-day history of redness, swelling, pain in his left lower leg. Symptoms started about one day after he will when in a four-hour car drive. He also had another four-hour car drive returning. Pain is worse with ambulating and with flexing his knee. There is no pain at rest but goes up to 6/10 if he bends his knee. He denies fever, chills, sweats. He denies chest pain, dyspnea. He does have history of TIA but no history of DVT. Past Medical History  Diagnosis Date  . GERD (gastroesophageal reflux disease)   . Stroke   . GERD (gastroesophageal reflux disease)   . TIA (transient ischemic attack)    History reviewed. No pertinent past surgical history. No family history on file. History  Substance Use Topics  . Smoking status: Never Smoker   . Smokeless tobacco: Not on file  . Alcohol Use: No    Review of Systems  All other systems reviewed and are negative.    Allergies  Review of patient's allergies indicates no known allergies.  Home Medications   Current Outpatient Rx  Name  Route  Sig  Dispense  Refill  . clopidogrel (PLAVIX) 75 MG tablet   Oral   Take 75 mg by mouth daily.         Marland Kitchen ibuprofen (ADVIL,MOTRIN) 200 MG tablet   Oral   Take 800 mg by mouth daily as needed for headache.         . Multiple Vitamins-Minerals (MULTIVITAMIN PO)   Oral   Take 1 tablet by mouth daily.         . pantoprazole (PROTONIX) 40 MG tablet   Oral   Take 80 mg by mouth daily.         . sertraline (ZOLOFT) 100 MG tablet   Oral   Take 100 mg by mouth daily.          BP 125/80  Pulse 80  Temp(Src) 98.5 F (36.9 C) (Oral)   Resp 14  SpO2 97% Physical Exam  Nursing note and vitals reviewed.  47 year old male, resting comfortably and in no acute distress. Vital signs are normal. Oxygen saturation is 97%, which is normal. Head is normocephalic and atraumatic. PERRLA, EOMI. Oropharynx is clear. Neck is nontender and supple without adenopathy or JVD. Back is nontender and there is no CVA tenderness. Lungs are clear without rales, wheezes, or rhonchi. Chest is nontender. Heart has regular rate and rhythm without murmur. Abdomen is soft, flat, nontender without masses or hepatosplenomegaly and peristalsis is normoactive. Extremities:there is mild erythema and warmth of the left lower leg. There no cords palpable and no tenderness to palpation. Negative Homans sign. Left calf circumferences 1 cm greater than right calf circumference.distal neurovascular is intact with strong pulses, prompt capillary refill, and normal sensation. Skin is warm and dry without rash. Neurologic: Mental status is normal, cranial nerves are intact, there are no motor or sensory deficits.  ED Course  Procedures (including critical care time) Results for orders placed during the hospital encounter of 09/07/12  CBC WITH DIFFERENTIAL      Result Value Range   WBC 10.2  4.0 - 10.5 K/uL  RBC 5.20  4.22 - 5.81 MIL/uL   Hemoglobin 13.3  13.0 - 17.0 g/dL   HCT 78.4  69.6 - 29.5 %   MCV 75.2 (*) 78.0 - 100.0 fL   MCH 25.6 (*) 26.0 - 34.0 pg   MCHC 34.0  30.0 - 36.0 g/dL   RDW 28.4  13.2 - 44.0 %   Platelets 244  150 - 400 K/uL   Neutrophils Relative % 54  43 - 77 %   Neutro Abs 5.5  1.7 - 7.7 K/uL   Lymphocytes Relative 34  12 - 46 %   Lymphs Abs 3.5  0.7 - 4.0 K/uL   Monocytes Relative 8  3 - 12 %   Monocytes Absolute 0.9  0.1 - 1.0 K/uL   Eosinophils Relative 3  0 - 5 %   Eosinophils Absolute 0.3  0.0 - 0.7 K/uL   Basophils Relative 1  0 - 1 %   Basophils Absolute 0.1  0.0 - 0.1 K/uL  BASIC METABOLIC PANEL      Result Value Range    Sodium 137  135 - 145 mEq/L   Potassium 3.4 (*) 3.5 - 5.1 mEq/L   Chloride 99  96 - 112 mEq/L   CO2 28  19 - 32 mEq/L   Glucose, Bld 103 (*) 70 - 99 mg/dL   BUN 12  6 - 23 mg/dL   Creatinine, Ser 1.02  0.50 - 1.35 mg/dL   Calcium 9.1  8.4 - 72.5 mg/dL   GFR calc non Af Amer >90  >90 mL/min   GFR calc Af Amer >90  >90 mL/min  PROTIME-INR      Result Value Range   Prothrombin Time 14.1  11.6 - 15.2 seconds   INR 1.11  0.00 - 1.49   1. Swelling of left lower extremity     MDM  Left leg erythema and swelling consistent with cellulitis and also consistent with DVT. He does not appear toxic and is resting comfortably at this point. He will be given an injection of anoxic parent and discharged to return for a venous Doppler test in the morning. He is given a prescription for cephalexin to start taking it if Doppler test is negative. Treatment options of warfarin versus rivaroxaban were discussed with patient and his wife.  Dione Booze, MD 09/08/12 [id number]  Dione Booze, MD 09/09/12 4034  Dione Booze, MD 09/09/12 2255

## 2012-09-30 ENCOUNTER — Other Ambulatory Visit: Payer: Self-pay | Admitting: Family Medicine

## 2012-09-30 ENCOUNTER — Other Ambulatory Visit: Payer: Self-pay | Admitting: Physician Assistant

## 2013-05-02 ENCOUNTER — Other Ambulatory Visit: Payer: Self-pay | Admitting: Family Medicine

## 2013-06-02 ENCOUNTER — Other Ambulatory Visit: Payer: Self-pay | Admitting: Physician Assistant

## 2013-07-11 ENCOUNTER — Other Ambulatory Visit: Payer: Self-pay | Admitting: Physician Assistant

## 2013-07-13 ENCOUNTER — Ambulatory Visit (INDEPENDENT_AMBULATORY_CARE_PROVIDER_SITE_OTHER): Payer: 59 | Admitting: Family Medicine

## 2013-07-13 VITALS — BP 120/92 | HR 72 | Temp 97.9°F | Resp 16 | Ht 66.5 in | Wt 198.0 lb

## 2013-07-13 DIAGNOSIS — E291 Testicular hypofunction: Secondary | ICD-10-CM

## 2013-07-13 DIAGNOSIS — N529 Male erectile dysfunction, unspecified: Secondary | ICD-10-CM

## 2013-07-13 DIAGNOSIS — K219 Gastro-esophageal reflux disease without esophagitis: Secondary | ICD-10-CM

## 2013-07-13 DIAGNOSIS — F524 Premature ejaculation: Secondary | ICD-10-CM

## 2013-07-13 DIAGNOSIS — F411 Generalized anxiety disorder: Secondary | ICD-10-CM | POA: Insufficient documentation

## 2013-07-13 DIAGNOSIS — Z125 Encounter for screening for malignant neoplasm of prostate: Secondary | ICD-10-CM

## 2013-07-13 DIAGNOSIS — Z0000 Encounter for general adult medical examination without abnormal findings: Secondary | ICD-10-CM

## 2013-07-13 DIAGNOSIS — Z8673 Personal history of transient ischemic attack (TIA), and cerebral infarction without residual deficits: Secondary | ICD-10-CM

## 2013-07-13 LAB — LIPID PANEL
CHOLESTEROL: 156 mg/dL (ref 0–200)
HDL: 28 mg/dL — AB (ref >39)
LDL Cholesterol: 55 mg/dL (ref 0–99)
TRIGLYCERIDES: 366 mg/dL — AB (ref <150)
Total CHOL/HDL Ratio: 5.6 Ratio
VLDL: 73 mg/dL — ABNORMAL HIGH (ref 0–40)

## 2013-07-13 LAB — CBC WITH DIFFERENTIAL/PLATELET
BASOS PCT: 1 % (ref 0–1)
Basophils Absolute: 0.1 10*3/uL (ref 0.0–0.1)
Eosinophils Absolute: 0.4 10*3/uL (ref 0.0–0.7)
Eosinophils Relative: 5 % (ref 0–5)
HEMATOCRIT: 43.9 % (ref 39.0–52.0)
HEMOGLOBIN: 15 g/dL (ref 13.0–17.0)
LYMPHS ABS: 3 10*3/uL (ref 0.7–4.0)
Lymphocytes Relative: 34 % (ref 12–46)
MCH: 24.9 pg — ABNORMAL LOW (ref 26.0–34.0)
MCHC: 34.2 g/dL (ref 30.0–36.0)
MCV: 72.8 fL — ABNORMAL LOW (ref 78.0–100.0)
MONO ABS: 0.7 10*3/uL (ref 0.1–1.0)
MONOS PCT: 8 % (ref 3–12)
NEUTROS ABS: 4.6 10*3/uL (ref 1.7–7.7)
Neutrophils Relative %: 52 % (ref 43–77)
Platelets: 290 10*3/uL (ref 150–400)
RBC: 6.03 MIL/uL — AB (ref 4.22–5.81)
RDW: 16.2 % — ABNORMAL HIGH (ref 11.5–15.5)
WBC: 8.9 10*3/uL (ref 4.0–10.5)

## 2013-07-13 LAB — COMPREHENSIVE METABOLIC PANEL
ALBUMIN: 4.6 g/dL (ref 3.5–5.2)
ALK PHOS: 85 U/L (ref 39–117)
ALT: 15 U/L (ref 0–53)
AST: 19 U/L (ref 0–37)
BUN: 10 mg/dL (ref 6–23)
CALCIUM: 9.5 mg/dL (ref 8.4–10.5)
CHLORIDE: 102 meq/L (ref 96–112)
CO2: 27 meq/L (ref 19–32)
Creat: 0.96 mg/dL (ref 0.50–1.35)
GLUCOSE: 106 mg/dL — AB (ref 70–99)
POTASSIUM: 4.7 meq/L (ref 3.5–5.3)
SODIUM: 137 meq/L (ref 135–145)
TOTAL PROTEIN: 7.2 g/dL (ref 6.0–8.3)
Total Bilirubin: 0.5 mg/dL (ref 0.2–1.2)

## 2013-07-13 LAB — POCT URINALYSIS DIPSTICK
BILIRUBIN UA: NEGATIVE
GLUCOSE UA: NEGATIVE
KETONES UA: NEGATIVE
Leukocytes, UA: NEGATIVE
Nitrite, UA: NEGATIVE
Protein, UA: NEGATIVE
RBC UA: NEGATIVE
SPEC GRAV UA: 1.02
UROBILINOGEN UA: 0.2
pH, UA: 5.5

## 2013-07-13 LAB — HEMOGLOBIN A1C
HEMOGLOBIN A1C: 5.7 % — AB (ref <5.7)
MEAN PLASMA GLUCOSE: 117 mg/dL — AB (ref <117)

## 2013-07-13 LAB — TSH: TSH: 2.236 u[IU]/mL (ref 0.350–4.500)

## 2013-07-13 MED ORDER — SERTRALINE HCL 100 MG PO TABS: 100.0000 mg | ORAL_TABLET | Freq: Every day | ORAL | Status: DC

## 2013-07-13 MED ORDER — PANTOPRAZOLE SODIUM 40 MG PO TBEC: 80.0000 mg | DELAYED_RELEASE_TABLET | Freq: Every day | ORAL | Status: DC

## 2013-07-13 MED ORDER — CLOPIDOGREL BISULFATE 75 MG PO TABS: 75.0000 mg | ORAL_TABLET | Freq: Every day | ORAL | Status: DC

## 2013-07-13 NOTE — Progress Notes (Addendum)
Subjective:  This chart was scribed for Zachary Forts, MD by [name], Medical Scribe. This patient was seen in Room 1 and the patient's care was started at 8:21 AM.   Patient ID: Zachary Massey, male    DOB: 1965-11-16, 48 y.o.   MRN: [id number]  HPI HPI Comments: Zachary Massey is a 48 y.o. male with a history of GERD, low testosterone, and TIA who presents to the [facility] for an annual exam.  The patient's last physical exam was January 2014.  The patient states that he has had a good year and has not developed any major health problems.  He states that he has been taking medication for GERD for 5 years.  The patient had a TIA in 2008.  He states that he could not speak in full sentences but denies experiencing any weakness.  He states that he had an episode in the ED and prior to arrival at the ED.  The patient states that he has not had another TIA since 2008.  The patient states that he was admitted and placed on Plavix by Dr. [name].  He states that he only sees Dr. [name] if he has another episode but otherwise sees Dr. [name].  He states that he takes Motrin intermittently, Protonix, multivitamins, and Zoloft.  He states that he started taking Zoloft to treat pre-mature ejaculation but later saw that it treated his anxiety when he lost his job.  The patient has a history of vasectomy but denies having any other surgeries.   His last colonoscopy was in 2012 after noticing blood in his stool.  The patient states that his colonoscopy was normal.  The patient's last TDAP was in 2011.  The patient states that he got a flu shot this year.  He denies having an eye exam performed recently.  He states that he has a Pharmacist, community but has not seen one in a long time as well.    The patient's mother is 68 and taking medication to treat hypercholesteremia.  The patient's father is 36 and has a history of GERD which he takes medication to treat.  The patient states that he has a sister with  no health problems.  The patient denies having a family history of thyroid problems but states that his wife's side does.    The patient states that he has been working for [employer] for 5 years. The patient states that he has been happily married for 18 years and has two twins that are 46 years old.  He states that he lives with his wife and children.  The patient states that he walks a lot on the job but does not do any exercise.  The patient states that he wears his seatbelt every time he drives.  He states that he has a gun in the home that is locked in a case and a gun safe.  The patient denies smoking, using drugs, and consuming alcohol.   The patient denies hearing loss, blurred or double vision, tinnitus, oral sores, cough, diarrhea, nausea, vomiting, constipation, hematochezia, leg swelling, back pain, neck pain, elbow or shoulder pain, abdominal pain, SOB, and chest pain as associated symptoms.  He states that he will experience intermittent numbness and tingling in his arms due to his carpal tunnel.   He states that he has problems with erectile dysfunction which is causing problems.  He states that his sex drive has decreased.  He denies taking Viagra  for his symptoms. The patient states that he snores and he feels as though he drags out of bed.  He states that he had a sleep study performed in 2008 and was not diagnosed with sleep apnea.  He states that he will nap throughout the day.  The patient states that he will have a bowel movement every other day that is normal for him.  The patient states that he will eat last at night.  The patient states that he will get up once every night to urinate but states that his urine stream is normal.  The patient states that he goes to bed between 10-11 PM every night and will wake up at 4 AM every morning.  The patient states that he does not have any problems falling or staying asleep.  He denies experiencing any emotional problems.     Past Medical History   Diagnosis Date  . GERD (gastroesophageal reflux disease)   . GERD (gastroesophageal reflux disease)   . TIA (transient ischemic attack)   . Stroke 03/13/2006    TIA (aphasia; admitted; Plavix therapy; maintained on Plavix.   Past Surgical History  Procedure Laterality Date  . Vasectomy     Family History  Problem Relation Age of Onset  . Hyperlipidemia Mother    History   Social History  . Marital Status: Married    Spouse Name: N/A    Number of Children: N/A  . Years of Education: N/A   Occupational History  . [employer] Chief Operating Officer   Social History Main Topics  . Smoking status: Never Smoker   . Smokeless tobacco: Never Used  . Alcohol Use: No  . Drug Use: No  . Sexual Activity: Yes   Other Topics Concern  . Not on file   Social History Narrative   Marital status: married x 18 years; happily married.      Children: twin 8 yo.      Lives: with wife, twins.      Exercise: none      Seatbelt:  100%      Guns:  Loaded and secured.  Gun safe.   Family History  Problem Relation Age of Onset  . Hyperlipidemia Mother     No Known Allergies  Review of Systems  Constitutional: Negative.   HENT: Negative.  Negative for hearing loss and tinnitus.   Eyes: Negative.  Negative for visual disturbance.  Respiratory: Negative for cough and shortness of breath.   Cardiovascular: Negative for chest pain and leg swelling.  Gastrointestinal: Negative for nausea, vomiting, diarrhea, constipation and blood in stool.  Endocrine: Negative.  Negative for polyuria.  Genitourinary: Negative for dysuria, decreased urine volume and difficulty urinating.  Musculoskeletal: Negative for arthralgias, back pain and neck pain.  Skin: Negative.   Neurological: Positive for numbness.  Hematological: Negative.   Psychiatric/Behavioral: Negative for sleep disturbance and dysphoric mood. The patient is nervous/anxious.   All other systems reviewed and are negative.    Objective:   Physical Exam  Nursing note and vitals reviewed. Constitutional: He is oriented to person, place, and time. He appears well-developed and well-nourished. No distress.  HENT:  Head: Normocephalic and atraumatic.  Right Ear: External ear normal.  Left Ear: External ear normal.  Mouth/Throat: Oropharynx is clear and moist. No oropharyngeal exudate.  Eyes: Conjunctivae and EOM are normal. Pupils are equal, round, and reactive to light. Right eye exhibits no discharge. Left eye exhibits no discharge. No scleral icterus.  Neck: Normal range of motion. Neck supple. No thyromegaly present.  Cardiovascular: Normal rate, regular rhythm and normal heart sounds.  Exam reveals no gallop and no friction rub.   No murmur heard. Pulmonary/Chest: Effort normal and breath sounds normal. No respiratory distress. He has no wheezes. He has no rales.  Abdominal: Soft. He exhibits no distension and no mass. There is no tenderness. There is no rebound and no guarding.  Genitourinary: Prostate normal and penis normal. No penile tenderness.  Musculoskeletal: Normal range of motion.  Lymphadenopathy:    He has no cervical adenopathy.  Neurological: He is alert and oriented to person, place, and time. He has normal reflexes. No cranial nerve deficit.  Skin: Skin is warm and dry. No rash noted. He is not diaphoretic.  Psychiatric: He has a normal mood and affect. His behavior is normal.    Results for orders placed in visit on 07/13/13  POCT URINALYSIS DIPSTICK      Result Value Ref Range   Color, UA yellow     Clarity, UA clear     Glucose, UA neg     Bilirubin, UA neg     Ketones, UA neg     Spec Grav, UA 1.020     Blood, UA neg     pH, UA 5.5     Protein, UA neg     Urobilinogen, UA 0.2     Nitrite, UA neg     Leukocytes, UA Negative       BP 120/92  Pulse 72  Temp(Src) 97.9 F (36.6 C) (Oral)  Resp 16  Ht 5' 6.5" (1.689 m)  Wt 198 lb (89.812 kg)  BMI 31.48 kg/m2  SpO2 95% Assessment &  Plan:  Routine general medical examination at a health care facility - Plan: CBC with Differential, Comprehensive metabolic panel, Lipid panel, TSH, POCT urinalysis dipstick, Hemoglobin A1c, EKG 12-Lead, PSA  History of TIA (transient ischemic attack)  GERD (gastroesophageal reflux disease)  Premature ejaculation  Generalized anxiety disorder  Hypogonadism male  Erectile dysfunction  1. CPE: anticipatory guidance --- exercise, weight loss.  Colonoscopy UTD.  Immunizations UTD.  Obtain labs.   2.  TIA: stable; refill of Plavix provided; warrants aggressive control of BP and lipids. 3.  GERD: moderately controlled; refill of Protonix provided; dietary modification reviewed. 4.  Premature ejaculation: stable/controlled with Zoloft.  Refill provided. 5.  Generalized anxiety disorder: controlled with Zoloft; refill provided. 6.  Hypogonadism: uncontrolled; testosterone supplementation risks too great with history of TIA. 7.  ED: uncontrolled.  Recommend discussing with neurology if could take Viagra with history of TIA.  Meds ordered this encounter  Medications  . clopidogrel (PLAVIX) 75 MG tablet    Sig: Take 1 tablet (75 mg total) by mouth daily. PATIENT NEEDS OFFICE VISIT FOR ADDITIONAL REFILLS    Dispense:  30 tablet    Refill:  11  . pantoprazole (PROTONIX) 40 MG tablet    Sig: Take 2 tablets (80 mg total) by mouth daily. PATIENT NEEDS OFFICE VISIT FOR ADDITIONAL REFILLS    Dispense:  60 tablet    Refill:  11  . sertraline (ZOLOFT) 100 MG tablet    Sig: Take 1 tablet (100 mg total) by mouth daily.    Dispense:  30 tablet    Refill:  11   I personally performed the services described in this documentation, which was scribed in my presence.  The recorded information has been reviewed and is accurate.  Zachary Massey, M.D.  [facility] [address] [place], [place]  [address] [phone] phone [phone] fax

## 2013-07-14 ENCOUNTER — Telehealth: Payer: Self-pay

## 2013-07-14 LAB — PSA: PSA: 1.23 ng/mL (ref <=4.00)

## 2013-07-14 NOTE — Telephone Encounter (Signed)
[facility] CALLED TO SAY THAT HUSBAND WAS SUPPOSE TO GET CLEARANCE FROM [facility] DR BEFORE TRYING ED MEDS,BUT THIS PT WAS RELEASED FROM THEIR CARE 7 YRS AGO AND WOULD REQUIRE PT TO BE SET UP ALL NEW WIITH THEM,THE PATIENT HAS 70000 $ DEDUCTIBLE SO THEY DO NOT WANT TO GO THAT ROUTE,SINCE HE HAS BEEN RELEASED 7 YRS AGO IS IT POSSIBLE TO FILL MEDS WITHOUT THAT CLEARANCE???   BEST PHONE FOR TONYA IS [phone]

## 2014-07-17 ENCOUNTER — Other Ambulatory Visit: Payer: Self-pay

## 2014-07-17 MED ORDER — CLOPIDOGREL BISULFATE 75 MG PO TABS: 75.0000 mg | ORAL_TABLET | Freq: Every day | ORAL | Status: DC

## 2014-07-17 NOTE — Telephone Encounter (Signed)
Pharm called asking for RF. I gave 1 mos w/note advising pt needs OV for more.

## 2014-08-05 ENCOUNTER — Ambulatory Visit (INDEPENDENT_AMBULATORY_CARE_PROVIDER_SITE_OTHER): Payer: Commercial Managed Care - PPO | Admitting: Family Medicine

## 2014-08-05 VITALS — BP 122/70 | HR 79 | Temp 97.6°F | Resp 18 | Ht 67.25 in | Wt 190.0 lb

## 2014-08-05 DIAGNOSIS — N529 Male erectile dysfunction, unspecified: Secondary | ICD-10-CM | POA: Diagnosis not present

## 2014-08-05 DIAGNOSIS — Z8639 Personal history of other endocrine, nutritional and metabolic disease: Secondary | ICD-10-CM | POA: Diagnosis not present

## 2014-08-05 DIAGNOSIS — Z8673 Personal history of transient ischemic attack (TIA), and cerebral infarction without residual deficits: Secondary | ICD-10-CM | POA: Diagnosis not present

## 2014-08-05 DIAGNOSIS — Z86010 Personal history of colonic polyps: Secondary | ICD-10-CM

## 2014-08-05 LAB — POCT CBC
Granulocyte percent: 57.3 %G (ref 37–80)
HCT, POC: 46.2 % (ref 43.5–53.7)
Hemoglobin: 14.6 g/dL (ref 14.1–18.1)
Lymph, poc: 2.6 (ref 0.6–3.4)
MCH, POC: 24.5 pg — AB (ref 27–31.2)
MCHC: 31.7 g/dL — AB (ref 31.8–35.4)
MCV: 77.2 fL — AB (ref 80–97)
MID (cbc): 0.5 (ref 0–0.9)
MPV: 8.1 fL (ref 0–99.8)
POC Granulocyte: 4.2 (ref 2–6.9)
POC LYMPH PERCENT: 35.4 %L (ref 10–50)
POC MID %: 7.3 %M (ref 0–12)
Platelet Count, POC: 244 10*3/uL (ref 142–424)
RBC: 5.98 M/uL (ref 4.69–6.13)
RDW, POC: 16.6 %
WBC: 7.3 10*3/uL (ref 4.6–10.2)

## 2014-08-05 LAB — COMPLETE METABOLIC PANEL WITH GFR
ALT: 16 U/L (ref 0–53)
AST: 19 U/L (ref 0–37)
Albumin: 4.6 g/dL (ref 3.5–5.2)
Alkaline Phosphatase: 88 U/L (ref 39–117)
BUN: 14 mg/dL (ref 6–23)
CO2: 25 mEq/L (ref 19–32)
Calcium: 9.4 mg/dL (ref 8.4–10.5)
Chloride: 103 mEq/L (ref 96–112)
Creat: 0.88 mg/dL (ref 0.50–1.35)
GFR, Est African American: >89 mL/min
GFR, Est Non African American: >89 mL/min
Glucose, Bld: 101 mg/dL — ABNORMAL HIGH (ref 70–99)
Potassium: 3.9 mEq/L (ref 3.5–5.3)
Sodium: 138 mEq/L (ref 135–145)
Total Bilirubin: 0.9 mg/dL (ref 0.2–1.2)
Total Protein: 7.9 g/dL (ref 6.0–8.3)

## 2014-08-05 LAB — LIPID PANEL
Cholesterol: 198 mg/dL (ref 0–200)
HDL: 26 mg/dL — ABNORMAL LOW (ref >=40)
LDL Cholesterol: 96 mg/dL (ref 0–99)
Total CHOL/HDL Ratio: 7.6 Ratio
Triglycerides: 380 mg/dL — ABNORMAL HIGH (ref <150)
VLDL: 76 mg/dL — ABNORMAL HIGH (ref 0–40)

## 2014-08-05 LAB — FERRITIN: Ferritin: 93 ng/mL (ref 22–322)

## 2014-08-05 MED ORDER — SERTRALINE HCL 100 MG PO TABS: 100.0000 mg | ORAL_TABLET | Freq: Every day | ORAL | Status: DC

## 2014-08-05 MED ORDER — CLOPIDOGREL BISULFATE 75 MG PO TABS: 75.0000 mg | ORAL_TABLET | Freq: Every day | ORAL | Status: DC

## 2014-08-05 NOTE — Patient Instructions (Signed)
Anemia, Nonspecific Anemia is a condition in which the concentration of red blood cells or hemoglobin in the blood is below normal. Hemoglobin is a substance in red blood cells that carries oxygen to the tissues of the body. Anemia results in not enough oxygen reaching these tissues.  CAUSES  Common causes of anemia include:   Excessive bleeding. Bleeding may be internal or external. This includes excessive bleeding from periods (in women) or from the intestine.   Poor nutrition.   Chronic kidney, thyroid, and liver disease.  Bone marrow disorders that decrease red blood cell production.  Cancer and treatments for cancer.  HIV, AIDS, and their treatments.  Spleen problems that increase red blood cell destruction.  Blood disorders.  Excess destruction of red blood cells due to infection, medicines, and autoimmune disorders. SIGNS AND SYMPTOMS   Minor weakness.   Dizziness.   Headache.  Palpitations.   Shortness of breath, especially with exercise.   Paleness.  Cold sensitivity.  Indigestion.  Nausea.  Difficulty sleeping.  Difficulty concentrating. Symptoms may occur suddenly or they may develop slowly.  DIAGNOSIS  Additional blood tests are often needed. These help your health care provider determine the best treatment. Your health care provider will check your stool for blood and look for other causes of blood loss.  TREATMENT  Treatment varies depending on the cause of the anemia. Treatment can include:   Supplements of iron, vitamin B12, or folic acid.   Hormone medicines.   A blood transfusion. This may be needed if blood loss is severe.   Hospitalization. This may be needed if there is significant continual blood loss.   Dietary changes.  Spleen removal. HOME CARE INSTRUCTIONS Keep all follow-up appointments. It often takes many weeks to correct anemia, and having your health care provider check on your condition and your response to  treatment is very important. SEEK IMMEDIATE MEDICAL CARE IF:   You develop extreme weakness, shortness of breath, or chest pain.   You become dizzy or have trouble concentrating.  You develop heavy vaginal bleeding.   You develop a rash.   You have bloody or black, tarry stools.   You faint.   You vomit up blood.   You vomit repeatedly.   You have abdominal pain.  You have a fever or persistent symptoms for more than 2-3 days.   You have a fever and your symptoms suddenly get worse.   You are dehydrated.  MAKE SURE YOU:  Understand these instructions.  Will watch your condition.  Will get help right away if you are not doing well or get worse. Document Released: 04/06/2004 Document Revised: 10/30/2012 Document Reviewed: 08/23/2012 Exit[employer] Patient Information 2015 [employer]. This information is not intended to replace advice given to you by your health care provider. Make sure you discuss any questions you have with your health care provider.  

## 2014-08-05 NOTE — Progress Notes (Signed)
° °  Subjective:    Patient ID: Zachary Massey, male    DOB: 10/23/1965, 49 y.o.   MRN: [id number]  This chart was scribed for Robyn Haber, MD, by Stephania Fragmin, ED Scribe. This patient was seen in room 14 and the patient's care was started at 9:05 AM.   HPI  HPI Comments: Zachary Massey is a 49 y.o. male who presents to the [facility] for a medication refill.  Patient states he needs refills for Plavix and Zoloft. He denies any dizzy spells or any other symptoms since having a tIA 8 years ago. He states he had several tests run after his tIA, and hasn't seemed to have problems since, although he was told he would need to be on Plavix for the rest of his life. Patient denies a history of hyperlipidemia.   He states he is happy with Zoloft for helping stabilize his emotions. Although he has side effects of erectile dysfunction, he states he is adjusted to it at this point.    Patient states he had a colonoscopy done which showed polyps.   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   Review of Systems  Constitutional: Negative for fatigue and unexpected weight change.  Eyes: Negative for visual disturbance.  Respiratory: Negative for cough, chest tightness and shortness of breath.   Cardiovascular: Negative for chest pain, palpitations and leg swelling.  Gastrointestinal: Negative for abdominal pain and blood in stool.  Neurological: Negative for dizziness, light-headedness and headaches.      Objective:   Physical Exam  Constitutional: He is oriented to person, place, and time. He appears well-developed and well-nourished. No distress.  HENT:  Head: Normocephalic and atraumatic.  Eyes: Conjunctivae and EOM are normal.  Neck: Neck supple. No tracheal deviation present.  No bruits. No adenopathy.  Cardiovascular: Normal rate, regular rhythm and normal heart sounds.   Pulmonary/Chest: Effort normal and breath sounds  normal. No respiratory distress.  Abdominal:  Fullness in RUQ.  Musculoskeletal: Normal range of motion.  Neurological: He is alert and oriented to person, place, and time.  Skin: Skin is warm and dry.  Psychiatric: He has a normal mood and affect. His behavior is normal.  Nursing note and vitals reviewed.     Assessment & Plan:     This chart was scribed in my presence and reviewed by me personally.    ICD-9-CM ICD-10-CM   1. H/O TIA (transient ischemic attack) and stroke V12.54 Z86.73 clopidogrel (PLAVIX) 75 MG tablet     POCT CBC  2. Erectile dysfunction, unspecified erectile dysfunction type 607.84 N52.9 sertraline (ZOLOFT) 100 MG tablet  3. H/O iron deficiency V12.3 Z86.39 POCT CBC     COMPLETE METABOLIC PANEL WITH GFR     Lipid panel     Ferritin  4. History of colonic polyps V12.72 Z86.010 POCT CBC     Signed, Robyn Haber, MD

## 2014-08-25 ENCOUNTER — Ambulatory Visit (INDEPENDENT_AMBULATORY_CARE_PROVIDER_SITE_OTHER): Payer: Self-pay | Admitting: Family Medicine

## 2014-08-25 VITALS — BP 130/75 | HR 66 | Temp 98.4°F | Resp 14 | Ht 68.5 in | Wt 191.4 lb

## 2014-08-25 DIAGNOSIS — Z021 Encounter for pre-employment examination: Secondary | ICD-10-CM

## 2014-08-25 DIAGNOSIS — Z024 Encounter for examination for driving license: Secondary | ICD-10-CM

## 2014-08-25 NOTE — Progress Notes (Signed)
Commercial Driver Medical Examination   Zachary Massey is a 49 y.o. male who presents today for a commercial driver fitness determination physical exam. The patient reports no problems. The following portions of the patient's history were reviewed and updated as appropriate: allergies, current medications, past family history, past medical history, past social history, past surgical history and problem list. Review of Systems A comprehensive review of systems was negative.   Objective:    Vision:   Visual Acuity Screening   Right eye Left eye Both eyes  Without correction: 20/13 20/10 20/10   With correction:     Comments: Colors: 6/6 Horizontal Field of Vision: 85 degrees bilaterally     Applicant can recognize and distinguish among traffic control signals and devices showing standard red, green, and amber colors.     Monocular Vision?: No   Hearing: Hearing Screening Comments: Whisper: 18ft      BP 130/75 mmHg  Pulse 66  Temp(Src) 98.4 F (36.9 C) (Oral)  Resp 14  Ht 5' 8.5" (1.74 m)  Wt 191 lb 6.4 oz (86.818 kg)  BMI 28.68 kg/m2  SpO2 97%  General Appearance:    Alert, cooperative, no distress, appears stated age  Head:    Normocephalic, without obvious abnormality, atraumatic  Eyes:    PERRL, conjunctiva/corneas clear, EOM's intact, fundi    benign, both eyes       Ears:    Normal TM's and external ear canals, both ears  Nose:   Nares normal, septum midline, mucosa normal, no drainage    or sinus tenderness  Throat:   Lips, mucosa, and tongue normal; teeth and gums normal  Neck:   Supple, symmetrical, trachea midline, no adenopathy;       thyroid:  No enlargement/tenderness/nodules; no carotid   bruit or JVD  Back:     Symmetric, no curvature, ROM normal, no CVA tenderness  Lungs:     Clear to auscultation bilaterally, respirations unlabored  Chest wall:    No tenderness or deformity  Heart:    Regular rate and rhythm, S1 and S2 normal, no murmur, rub   or  gallop  Abdomen:     Soft, non-tender, bowel sounds active all four quadrants,    no masses, no organomegaly  Genitalia:    Rectal:    Extremities:   Extremities normal, atraumatic, no cyanosis or edema  Pulses:   2+ and symmetric all extremities  Skin:   Skin color, texture, turgor normal, no rashes or lesions  Lymph nodes:   Cervical, supraclavicular, and axillary nodes normal  Neurologic:   CNII-XII intact. Normal strength, sensation and reflexes      throughout    Labs: UA: 1.030, 30+ prot, neg sugar, neg blood  Assessment:    Healthy male exam.  Meets standards, but periodic monitoring required due to h/o CVA vs TIA.  Driver qualified only for 1 year.    Plan:    Medical examiners certificate completed and printed. Return as needed.

## 2014-08-27 ENCOUNTER — Other Ambulatory Visit: Payer: Self-pay

## 2014-08-27 DIAGNOSIS — Z8673 Personal history of transient ischemic attack (TIA), and cerebral infarction without residual deficits: Secondary | ICD-10-CM

## 2014-08-27 MED ORDER — CLOPIDOGREL BISULFATE 75 MG PO TABS: 75.0000 mg | ORAL_TABLET | Freq: Every day | ORAL | Status: DC

## 2014-12-22 ENCOUNTER — Ambulatory Visit (INDEPENDENT_AMBULATORY_CARE_PROVIDER_SITE_OTHER): Payer: Commercial Managed Care - PPO | Admitting: Family Medicine

## 2014-12-22 VITALS — BP 134/84 | HR 72 | Temp 98.2°F | Resp 16 | Ht 68.5 in | Wt 192.0 lb

## 2014-12-22 DIAGNOSIS — Z8673 Personal history of transient ischemic attack (TIA), and cerebral infarction without residual deficits: Secondary | ICD-10-CM | POA: Diagnosis not present

## 2014-12-22 DIAGNOSIS — R413 Other amnesia: Secondary | ICD-10-CM

## 2014-12-22 NOTE — Progress Notes (Signed)
Patient ID: Zachary Massey, male    DOB: [birth year]  Age: 49 y.o. MRN: [id number]  Chief Complaint  Patient presents with  . Memory Loss    hx tia 2008 needs referral to neurology  Dr. Leonie Massey  . Flu Vaccine    Subjective:   49 year old Massey who is here because of concerns of short-term memory loss. He has forgotten a number of things in communication with his wife.  A few days ago he couldn't remember anniversary years, saying 42 rather than 68 when he knew well. Other things in conversation with his children also have been a problem. He has been able to function okay on his job. The children as well as his wife have noticed things.  Some years ago he had a TIA, was evaluated by neurologist Dr.Sethi , and ultimately did fine. He has not been on any mind altering medications. Does not smoke or drink. He is on sertraline for premature ejaculation and also anxiety. He has been on the same dose of that for a long time.  Current allergies, medications, problem list, past/family and social histories reviewed.  Objective:  BP 134/84 mmHg  Pulse 72  Temp(Src) 98.2 F (36.8 C) (Oral)  Resp 16  Ht 5' 8.5" (1.74 m)  Wt 192 lb (87.091 kg)  BMI 28.77 kg/m2  SpO2 96%  Normal neurologic exam. Cranial nerves II-12 grossly intact. Motor symmetrical. Sensory grossly normal. Fine motor normal. He is alert and oriented. Mental status grossly normal.  Assessment & Plan:   Assessment: 1. Memory loss, short term   2. History of TIA (transient ischemic attack)       Plan: Will refer him back to his same neurologist for further evaluation.   Patient Instructions  No new instructions for the time being.  Referral is being made to neurology Dr.Sethi.  If increasing problems before you get that appointment please return.     No Follow-up on file.   Zachary Dehner, MD 12/22/2014

## 2014-12-22 NOTE — Patient Instructions (Signed)
No new instructions for the time being.  Referral is being made to neurology Dr.Sethi.  If increasing problems before you get that appointment please return.

## 2015-02-03 ENCOUNTER — Encounter: Payer: Self-pay | Admitting: Neurology

## 2015-02-03 ENCOUNTER — Ambulatory Visit (INDEPENDENT_AMBULATORY_CARE_PROVIDER_SITE_OTHER): Payer: Commercial Managed Care - PPO | Admitting: Neurology

## 2015-02-03 VITALS — BP 116/77 | HR 69 | Ht 68.0 in | Wt 194.2 lb

## 2015-02-03 DIAGNOSIS — R413 Other amnesia: Secondary | ICD-10-CM | POA: Diagnosis not present

## 2015-02-03 DIAGNOSIS — N529 Male erectile dysfunction, unspecified: Secondary | ICD-10-CM

## 2015-02-03 DIAGNOSIS — G3184 Mild cognitive impairment, so stated: Secondary | ICD-10-CM | POA: Diagnosis not present

## 2015-02-03 MED ORDER — SERTRALINE HCL 100 MG PO TABS
150.0000 mg | ORAL_TABLET | Freq: Every day | ORAL | Status: DC
Start: 2015-02-03 — End: 2015-09-16

## 2015-02-03 NOTE — Progress Notes (Signed)
[facility] [address] [place]. [place] [address] [phone]       OFFICE CONSULT NOTE  Zachary Massey Date of Birth:  Dec 15, 1965 Medical Record Number:  [id number]   Referring MD:  Lou Miner   Reason for Referral:  Memory loss  HPI: Mr Zachary Massey is a 49 year Caucasian male who for the last 1 year has been having slowly progressive memory difficulties. History is provided by patient and his wife. Patient has recently forgotten how long they have been married and despite hints was not able to accurately remember. He has also recently misplaced important documents and not found them 4 hours after searching. He works as a Education officer, museum and has felt that he needs to increase in the right down notes to help him perform his work. He occasionally has forgot to email his work assignment to his boss at night something that he has done for years. He does have history of chronic anxiety disorder as well as the premature ejaculation for which he takes Zoloft. He however denies feeling depressed. He sleeps very well. Does not complain of daytime sleepiness or tiredness. He denies any recent headache, vision difficulties, vertigo, gait or balance problems. There is no history of clots head injury with loss of consciousness or seizures. Is no family history of Alzheimer's. He has not had any recent brain imaging study done. He was seen by me in the hospital in 2008 her episode of TIA. At that time neurovascular evaluation was unremarkable and he was started on Plavix which is taking regularly. He did not keep follow-up appointments with me.  ROS:   14 system review of systems is positive for  Fatigue, snoring, importance, easy bleeding, memory loss, anxiety, not enough sleep, decreased energy and all other systems negative  PMH:  Past Medical History  Diagnosis Date  . GERD (gastroesophageal reflux disease)   . GERD (gastroesophageal reflux disease)   . TIA (transient  ischemic attack)   . Stroke ([place]) 03/13/2006    TIA (aphasia; admitted; Plavix therapy; maintained on Plavix.  Marland Kitchen Anxiety   . Premature ejaculation   . Hypogonadism male   . Erectile dysfunction     Social History:  Social History   Social History  . Marital Status: Married    Spouse Name: N/A  . Number of Children: N/A  . Years of Education: N/A   Occupational History  . [employer] Chief Operating Officer   Social History Main Topics  . Smoking status: Never Smoker   . Smokeless tobacco: Never Used  . Alcohol Use: No  . Drug Use: No  . Sexual Activity: Yes   Other Topics Concern  . Not on file   Social History Narrative   Marital status: married x 18 years; happily married.      Children: twin 31 yo.      Lives: with wife, twins.      Exercise: none      Seatbelt:  100%      Guns:  Loaded and secured.  Gun safe.    Medications:   Current Outpatient Prescriptions on File Prior to Visit  Medication Sig Dispense Refill  . clopidogrel (PLAVIX) 75 MG tablet Take 1 tablet (75 mg total) by mouth daily. 30 tablet 11  . dexlansoprazole (DEXILANT) 60 MG capsule Take 60 mg by mouth daily.    Marland Kitchen ibuprofen (ADVIL,MOTRIN) 200 MG tablet Take 800 mg by mouth daily as needed for headache.  No current facility-administered medications on file prior to visit.    Allergies:  No Known Allergies  Physical Exam General: well developed, well nourished 49 year Caucasian male, seated, in no evident distress Head: head normocephalic and atraumatic.   Neck: supple with no carotid or supraclavicular bruits Cardiovascular: regular rate and rhythm, no murmurs Musculoskeletal: no deformity Skin:  no rash/petichiae Vascular:  Normal pulses all extremities  Neurologic Exam Mental Status: Awake and fully alert. Oriented to place and time. Recent and remote memory intact. Attention span, concentration and fund of knowledge appropriate. Mood and affect appropriate. MOCA score 20/30. Clock  drawing 4/4. Animal Naming 7/ Geriatric Depression scale 4 only Cranial Nerves: Fundoscopic exam reveals sharp disc margins. Pupils equal, briskly reactive to light. Extraocular movements full without nystagmus. Visual fields full to confrontation. Hearing intact. Facial sensation intact. Face, tongue, palate moves normally and symmetrically.  Motor: Normal bulk and tone. Normal strength in all tested extremity muscles. Sensory.: intact to touch , pinprick , position and vibratory sensation.  Coordination: Rapid alternating movements normal in all extremities. Finger-to-nose and heel-to-shin performed accurately bilaterally. Gait and Station: Arises from chair without difficulty. Stance is normal. Gait demonstrates normal stride length and balance . Able to heel, toe and tandem walk without difficulty.  Reflexes: 1+ and symmetric. Toes downgoing.       ASSESSMENT: 49 year Caucasian male with a one-year history of memory and mild cognitive difficulties of unclear etiology. Possibly mild cognitive impairment but suspect underlying component of anxiety/depression. Remote history of TIA in 2008 and stable from neurovascular standpoint.    PLAN: I had a long discussion with the patient and his wife regarding his memory loss and mild cognitive impairment which is of undetermined etiology. I recommend further evaluation by checking memory panel labs as well as MRI scan of the brain and EEG. I encouraged him to increase his Zoloft to 150 mg to help with any underlying anxiety which may be contributing. We also talked about memory compensation strategies. Continue Plavix for stroke prevention and return for follow-up in 2 months or call earlier if necessary. Greater than 50% time during this 45 minute consultation was spent on counseling and coordination of care.  Antony Contras, MD   Note: This document was prepared with digital dictation and possible smart phrase technology. Any transcriptional errors  that result from this process are unintentional.

## 2015-02-03 NOTE — Patient Instructions (Signed)
I had a long discussion with the patient and his wife regarding his memory loss and mild cognitive impairment which is of undetermined etiology. I recommend further evaluation by checking memory panel labs as well as MRI scan of the brain and EEG. I encouraged him to increase his Zoloft to 150 mg to help with any underlying anxiety which may be contributing. We also talked about memory compensation strategies. Continue Plavix for stroke prevention and return for follow-up in 2 months or call earlier if necessary.  Memory Compensation Strategies  1. Use "WARM" strategy.  W= write it down  A= associate it  R= repeat it  M= make a mental note  2.   You can keep a Social worker.  Use a 3-ring notebook with sections for the following: calendar, important names and phone numbers,  medications, doctors' names/phone numbers, lists/reminders, and a section to journal what you did  each day.   3.    Use a calendar to write appointments down.  4.    Write yourself a schedule for the day.  This can be placed on the calendar or in a separate section of the Memory Notebook.  Keeping a  regular schedule can help memory.  5.    Use medication organizer with sections for each day or morning/evening pills.  You may need help loading it  6.    Keep a basket, or pegboard by the door.  Place items that you need to take out with you in the basket or on the pegboard.  You may also want to  include a message board for reminders.  7.    Use sticky notes.  Place sticky notes with reminders in a place where the task is performed.  For example: " turn off the  stove" placed by the stove, "lock the door" placed on the door at eye level, " take your medications" on  the bathroom mirror or by the place where you normally take your medications.  8.    Use alarms/timers.  Use while cooking to remind yourself to check on food or as a reminder to take your medicine, or as a  reminder to make a call, or as a reminder to  perform another task, etc.

## 2015-02-05 LAB — DEMENTIA PANEL
HOMOCYSTEINE: 7.3 umol/L (ref 0.0–15.0)
RPR: NONREACTIVE
TSH: 1.71 u[IU]/mL (ref 0.450–4.500)
Vitamin B-12: 561 pg/mL (ref 211–946)

## 2015-02-09 ENCOUNTER — Telehealth: Payer: Self-pay

## 2015-02-09 NOTE — Telephone Encounter (Signed)
-----   Message from Garvin Fila, MD sent at 02/08/2015  5:38 PM EST ----- Kindly inform the patient that all blood work for reversible causes of memory loss and were normal including vitamin B12, thyroid hormone and syphilis test

## 2015-02-09 NOTE — Telephone Encounter (Signed)
Rn call patient to notify him that the blood work for vitamin b 12, thyroid hormone, and syphilis were are normal. Pt verbalized understanding.

## 2015-02-24 ENCOUNTER — Other Ambulatory Visit: Payer: Commercial Managed Care - PPO

## 2015-03-09 ENCOUNTER — Other Ambulatory Visit: Payer: Commercial Managed Care - PPO

## 2015-04-07 ENCOUNTER — Ambulatory Visit (INDEPENDENT_AMBULATORY_CARE_PROVIDER_SITE_OTHER): Payer: Commercial Managed Care - PPO | Admitting: Family Medicine

## 2015-04-07 VITALS — BP 118/82 | HR 75 | Temp 98.1°F | Resp 18 | Ht 68.5 in | Wt 197.4 lb

## 2015-04-07 DIAGNOSIS — J0100 Acute maxillary sinusitis, unspecified: Secondary | ICD-10-CM | POA: Diagnosis not present

## 2015-04-07 DIAGNOSIS — R05 Cough: Secondary | ICD-10-CM

## 2015-04-07 DIAGNOSIS — R059 Cough, unspecified: Secondary | ICD-10-CM

## 2015-04-07 MED ORDER — HYDROCOD POLST-CPM POLST ER 10-8 MG/5ML PO SUER: 5.0000 mL | Freq: Two times a day (BID) | ORAL | Status: DC | PRN

## 2015-04-07 MED ORDER — AMOXICILLIN-POT CLAVULANATE 875-125 MG PO TABS
1.0000 | ORAL_TABLET | Freq: Two times a day (BID) | ORAL | Status: DC
Start: 2015-04-07 — End: 2016-06-23

## 2015-04-07 NOTE — Progress Notes (Signed)
@[email or web address] @  By signing my name below, I, Raven Small, attest that this documentation has been prepared under the direction and in the presence of Robyn Haber, MD.  Electronically Signed: Thea Alken, ED Scribe. 04/07/2015. 9:07 AM.  Patient ID: Zachary Massey MRN: [id number], DOB: 1966/02/25, 50 y.o. Date of Encounter: 04/07/2015, 9:04 AM  Primary Physician: Kennon Portela, MD  Chief Complaint:  Chief Complaint  Patient presents with  . Sinusitis    x3 days    HPI: 50 y.o. year old male with history below presents with sinus congestion that began 3 days ago. He reports trouble sleeping at night due to congestion and productive cough. He also reports post nasal drip, exacerbating his reflux. He denies sick contacts. No hx of asthma. Pt is not a smoker. He denies drug allergies.   Pt is a Advice worker.   Past Medical History  Diagnosis Date  . GERD (gastroesophageal reflux disease)   . GERD (gastroesophageal reflux disease)   . TIA (transient ischemic attack)   . Stroke ([place]) 03/13/2006    TIA (aphasia; admitted; Plavix therapy; maintained on Plavix.  Marland Kitchen Anxiety   . Premature ejaculation   . Hypogonadism male   . Erectile dysfunction      Home Meds: Prior to Admission medications   Medication Sig Start Date End Date Taking? Authorizing Provider  clopidogrel (PLAVIX) 75 MG tablet Take 1 tablet (75 mg total) by mouth daily. 08/27/14  Yes Shawnee Knapp, MD  COCONUT OIL PO Take by mouth. Take 3 capsule a day   Yes Historical Provider, MD  dexlansoprazole (DEXILANT) 60 MG capsule Take 60 mg by mouth daily.   Yes Historical Provider, MD  ibuprofen (ADVIL,MOTRIN) 200 MG tablet Take 800 mg by mouth daily as needed for headache.   Yes Historical Provider, MD  sertraline (ZOLOFT) 100 MG tablet Take 1.5 tablets (150 mg total) by mouth daily. 02/03/15  Yes Garvin Fila, MD    Allergies: No Known Allergies  Social History   Social History  . Marital Status: Married    Spouse  Name: N/A  . Number of Children: N/A  . Years of Education: N/A   Occupational History  . [employer] Chief Operating Officer   Social History Main Topics  . Smoking status: Never Smoker   . Smokeless tobacco: Never Used  . Alcohol Use: No  . Drug Use: No  . Sexual Activity: Yes   Other Topics Concern  . Not on file   Social History Narrative   Marital status: married x 18 years; happily married.      Children: twin 66 yo.      Lives: with wife, twins.      Exercise: none      Seatbelt:  100%      Guns:  Loaded and secured.  Gun safe.     Review of Systems: Constitutional: negative for chills, fever, night sweats, weight changes, or fatigue  HEENT: negative for vision changes, hearing loss, congestion, rhinorrhea,epistaxis,  Cardiovascular: negative for chest pain or palpitations Respiratory: negative for hemoptysis, wheezing, shortness of breath,  Abdominal: negative for abdominal pain, nausea, vomiting, diarrhea, or constipation Dermatological: negative for rash Neurologic: negative for headache, dizziness, or syncope All other systems reviewed and are otherwise negative with the exception to those above and in the HPI.   Physical Exam: Blood pressure 118/82, pulse 75, temperature 98.1 F (36.7 C), temperature source Oral, resp. rate 18, height 5' 8.5" (1.74 m), weight  197 lb 6.4 oz (89.54 kg), SpO2 95 %., Body mass index is 29.57 kg/(m^2). General: Well developed, well nourished, in no acute distress. Head: Normocephalic, atraumatic, eyes without discharge, sclera non-icteric,mucous purulent discharge . Bilateral auditory canals clear, TM's are without perforation, pearly grey and translucent with reflective cone of light bilaterally. Oral cavity moist, posterior pharynx without exudate, erythema, peritonsillar abscess, or post nasal drip.   Neck: Supple. No thyromegaly. Full ROM. No lymphadenopathy. Lungs: Clear bilaterally to auscultation without wheezes, rales, or rhonchi.  Breathing is unlabored. Heart: RRR with S1 S2. No murmurs, rubs, or gallops appreciated. Abdomen: Soft, non-tender, non-distended with normoactive bowel sounds. No hepatomegaly. No rebound/guarding. No obvious abdominal masses. Msk:  Strength and tone normal for age. Extremities/Skin: Warm and dry. No clubbing or cyanosis. No edema. 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 Moves all extremities spontaneously. Gait is normal. CNII-XII grossly in tact. Psych:  Responds to questions appropriately with a normal affect.     ASSESSMENT AND PLAN:  50 y.o. year old male with Acute maxillary sinusitis, recurrence not specified - Plan: amoxicillin-clavulanate (AUGMENTIN) 875-125 MG tablet  Cough - Plan: chlorpheniramine-HYDROcodone (TUSSIONEX PENNKINETIC ER) 10-8 MG/5ML SUER  This chart was scribed in my presence and reviewed by me personally.    Signed, Robyn Haber, MD 04/07/2015 9:04 AM

## 2015-04-07 NOTE — Patient Instructions (Signed)

## 2015-04-28 ENCOUNTER — Ambulatory Visit: Payer: Commercial Managed Care - PPO | Admitting: Neurology

## 2015-09-16 ENCOUNTER — Telehealth: Payer: Self-pay | Admitting: Neurology

## 2015-09-16 ENCOUNTER — Other Ambulatory Visit: Payer: Self-pay

## 2015-09-16 DIAGNOSIS — N529 Male erectile dysfunction, unspecified: Secondary | ICD-10-CM

## 2015-09-16 MED ORDER — SERTRALINE HCL 100 MG PO TABS: 150.0000 mg | ORAL_TABLET | Freq: Every day | ORAL | Status: DC

## 2015-09-16 MED FILL — SERTRALINE HCL 100 MG TAB: 100 | 60 days supply | Qty: 90 | Fill #0

## 2015-09-16 NOTE — Telephone Encounter (Signed)
Rn call patient back about refills for zoloft. Patient had appt schedule in February 2017,but cancel. Pt stated he was feeling good and cancel the appt. Rn stated a 3 month refill can be done until he sees his Primary. Pt verbalized understanding. He wanted it sent to [facility]/.

## 2015-09-16 NOTE — Telephone Encounter (Signed)
Patient called to request refill of sertraline (ZOLOFT) 100 MG tablet to [facility], also advises, he has new insurance through [employer].

## 2015-09-20 MED FILL — DEXILANT DR 60 MG CAPSULE: 60 | 90 days supply | Qty: 90 | Fill #0

## 2015-09-21 MED FILL — CLOPIDOGREL 75 MG TABLET: 75 | 90 days supply | Qty: 90 | Fill #0

## 2015-11-24 MED FILL — SERTRALINE HCL 100 MG TAB: 100 | 90 days supply | Qty: 90 | Fill #0

## 2015-12-23 MED FILL — DEXILANT DR 60 MG CAPSULE: 60 | 90 days supply | Qty: 90 | Fill #1

## 2015-12-23 MED FILL — CLOPIDOGREL 75 MG TABLET: 75 | 90 days supply | Qty: 90 | Fill #1

## 2016-02-22 MED FILL — SERTRALINE HCL 100 MG TAB: 100 | 90 days supply | Qty: 90 | Fill #1

## 2016-02-25 ENCOUNTER — Emergency Department
Admission: EM | Admit: 2016-02-25 | Discharge: 2016-02-25 | Disposition: A | Discharge status: Home / Self Care | Payer: 59 | Source: Home / Self Care | Attending: Family Medicine | Admitting: Family Medicine

## 2016-02-25 ENCOUNTER — Encounter: Payer: Self-pay | Admitting: Emergency Medicine

## 2016-02-25 DIAGNOSIS — M67432 Ganglion, left wrist: Secondary | ICD-10-CM | POA: Diagnosis not present

## 2016-02-25 NOTE — Consult Note (Signed)
   Subjective:    I'm seeing this patient as a consultation for:  Dr. 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 Pain is moderate, persistent, he desires interventional treatment today.  Past medical history:  Negative.  See flowsheet/record as well for more information.  Surgical history: Negative.  See flowsheet/record as well for more information.  Family history: Negative.  See flowsheet/record as well for more information.  Social history: Negative.  See flowsheet/record as well for more information.  Allergies, and medications have been entered into the medical record, reviewed, and no changes needed.   Review of Systems: No headache, visual changes, nausea, vomiting, diarrhea, constipation, dizziness, abdominal pain, skin rash, fevers, chills, night sweats, weight loss, swollen lymph nodes, body aches, joint swelling, muscle aches, chest pain, shortness of breath, mood changes, visual or auditory hallucinations.   Objective:   General: Well Developed, well nourished, and in no acute distress.  Neuro/Psych: Alert and oriented x3, extra-ocular muscles intact, able to move all 4 extremities, sensation grossly intact. Skin: Warm and dry, no rashes noted.  Respiratory: Not using accessory muscles, speaking in full sentences, trachea midline.  Cardiovascular: Pulses palpable, no extremity edema. Abdomen: Does not appear distended. Left Wrist: Visible ganglion cyst over the dorsum of the wrist, approximately 2-3 cm across. ROM smooth and normal with good flexion and extension and ulnar/radial deviation that is symmetrical with opposite wrist. Palpation is normal over metacarpals, navicular, lunate, and TFCC; tendons without tenderness/ swelling No snuffbox tenderness. No tenderness over Canal of [place]. Strength 5/5 in all directions  without pain. Negative Finkelstein, tinel's and phalens. Negative Watson's test.  Procedure: Real-time Ultrasound Guided aspiration/injection of left dorsal wrist ganglion cyst Device: GE Logiq E  Verbal informed consent obtained.  Time-out conducted.  Noted no overlying erythema, induration, or other signs of local infection.  Skin prepped in a sterile fashion.  Local anesthesia: Topical Ethyl chloride.  With sterile technique and under real time ultrasound guidance:  Using 18-gauge needle aspirated approximately 3 mL of thick fluid, syringe switched and 1 mL kenalog 40 injected easily Completed without difficulty  Pain immediately resolved suggesting accurate placement of the medication.  Advised to call if fevers/chills, erythema, induration, drainage, or persistent bleeding.  Images permanently stored and available for review in the ultrasound unit.  Impression: Technically successful ultrasound guided injection.  Impression and Recommendations:   This case required medical decision making of moderate complexity.  1. Ganglion cyst of the left wrist: Aspiration and injection as above, return to see me in one month. ___________________________________________ Gwen Her. Dianah Field, M.D., ABFM., CAQSM. Primary Care and [place] Instructor of [employer] [facility] [employer] [facility]

## 2016-02-25 NOTE — Discharge Instructions (Signed)
Follow instructions as per Dr. Thomas Thekkekandam  

## 2016-02-25 NOTE — ED Provider Notes (Signed)
[facility]ach[facility] C[facility]    CSN: [id number] Arrival date & time: 02/25/16  1352     History   Chief Complaint Chief Complaint  Patient presents with  . Hand Problem    HPI Zachary Massey is a 50 y.o. male.   Patient complains of 3 week history of painful swelling on the dorsum of his left wrist.  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  He recalls no injury.   The history is provided by the patient.  Wrist Pain  This is a new problem. The current episode started more than 1 week ago. The problem occurs constantly. The problem has been gradually worsening. Associated symptoms comments: none. Exacerbated by: flexion/extension of wrist. Nothing relieves the symptoms. He has tried nothing for the symptoms.    Past Medical History:  Diagnosis Date  . Anxiety   . Erectile dysfunction   . GERD (gastroesophageal reflux disease)   . GERD (gastroesophageal reflux disease)   . Hypogonadism male   . Premature ejaculation   . Stroke ([place]) 03/13/2006   TIA (aphasia; admitted; Plavix therapy; maintained on Plavix.  Marland Kitchen TIA (transient ischemic attack)     Patient Active Problem List   Diagnosis Date Noted  . Ganglion cyst of wrist, left 02/25/2016  . Memory loss 02/03/2015  . MCI (mild cognitive impairment) 02/03/2015  . Erectile dysfunction 07/13/2013  . Hypogonadism male 07/13/2013  . Generalized anxiety disorder 07/13/2013  . Premature ejaculation 07/13/2013  . Ichthyosis 04/09/2012  . GERD (gastroesophageal reflux disease) 12/03/2011  . History of anemia 12/01/2011  . History of TIA (transient ischemic attack) 12/01/2011    Past Surgical History:  Procedure Laterality Date  . VASECTOMY         Home Medications    Prior to Admission medications   Medication Sig Start Date End Date Taking? Authorizing Provider  amoxicillin-clavulanate (AUGMENTIN) 875-125 MG tablet Take 1 tablet by mouth 2 (two) times daily. 04/07/15   Robyn Haber, MD  chlorpheniramine-HYDROcodone [facility] ER) 10-8 MG/5ML SUER Take 5 mLs by mouth every 12 (twelve) hours as needed. 04/07/15   Robyn Haber, MD  clopidogrel (PLAVIX) 75 MG tablet Take 1 tablet (75 mg total) by mouth daily. 08/27/14   Shawnee Knapp, MD  COCONUT OIL PO Take by mouth. Take 3 capsule a day    Historical Provider, MD  dexlansoprazole (DEXILANT) 60 MG capsule Take 60 mg by mouth daily.    Historical Provider, MD  ibuprofen (ADVIL,MOTRIN) 200 MG tablet Take 800 mg by mouth daily as needed for headache.    Historical Provider, MD  sertraline (ZOLOFT) 100 MG tablet Take 1.5 tablets (150 mg total) by mouth daily. 09/16/15   Garvin Fila, MD    Family History Family History  Problem Relation Age of Onset  . Hyperlipidemia Mother   . Stroke Paternal Grandfather   . Dementia Paternal Grandfather     Social History Social History  Substance Use Topics  . Smoking status: Never Smoker  . Smokeless tobacco: Never Used  . Alcohol use No     Allergies   Patient has no known allergies.   Review of Systems Review of Systems  All other systems reviewed and are negative.    Physical Exam Triage Vital Signs ED Triage Vitals  Enc Vitals Group     BP 02/25/16 1433 145/88     Pulse Rate 02/25/16 1433 66     Resp --  Temp 02/25/16 1433 98.2 F (36.8 C)     Temp Source 02/25/16 1433 Oral     SpO2 02/25/16 1433 97 %     Weight --      Height --      Head Circumference --      Peak Flow --      Pain Score 02/25/16 1436 0     Pain Loc --      Pain Edu? --      Excl. in [place]? --    No data found.   Updated Vital Signs BP 145/88 (BP Location: Right Arm)   Pulse 66   Temp 98.2 F (36.8 C) (Oral)   SpO2 97%   Visual Acuity Right Eye Distance:   Left Eye Distance:   Bilateral Distance:    Right Eye Near:   Left Eye Near:    Bilateral Near:     Physical Exam  Constitutional: He appears well-developed and well-nourished. No distress.    HENT:  Head: Atraumatic.  Eyes: Pupils are equal, round, and reactive to light.  Cardiovascular: Normal rate.   Pulmonary/Chest: Effort normal.  Musculoskeletal:       Left hand: He exhibits tenderness. He exhibits normal range of motion, no bony tenderness, no deformity, no laceration and no swelling. Normal sensation noted. Normal strength noted.       Hands: Left wrist has full range of motion.  There is distinct subcutaneous 2cm diameter swelling on dorsum over extensor tendons as noted on diagram.  The area is mildly tender to palpation, but there is no erythema, fluctuance, or warmth.     Neurological: He is alert.  Skin: Skin is warm and dry. No rash noted.  Nursing note and vitals reviewed.    UC Treatments / Results  Labs (all labs ordered are listed, but only abnormal results are displayed) Labs Reviewed - No data to display  EKG  EKG Interpretation None       Radiology No results found.  Procedures Procedures (including critical care time)  Medications Ordered in UC Medications - No data to display   Initial Impression / Assessment and Plan / UC Course  I have reviewed the triage vital signs and the nursing notes.  Pertinent labs & imaging results that were available during my care of the patient were reviewed by me and considered in my medical decision making (see chart for details).  Clinical Course   Will refer to Dr. Aundria Mems for cyst aspiration and management.     Final Clinical Impressions(s) / UC Diagnoses   Final diagnoses:  Ganglion cyst of wrist, left    New Prescriptions New Prescriptions   No medications on file     Kandra Nicolas, MD 02/25/16 1558

## 2016-02-25 NOTE — ED Triage Notes (Signed)
Pt c/o knot on his left hand for the last 3 weeks. Denies pain or injury.

## 2016-04-03 MED FILL — DEXILANT DR 60 MG CAPSULE: 60 | 30 days supply | Qty: 30 | Fill #0

## 2016-04-03 MED FILL — CLOPIDOGREL 75 MG TABLET: 75 | 90 days supply | Qty: 90 | Fill #2

## 2016-05-03 MED FILL — DEXILANT DR 60 MG CAPSULE: 60 | 30 days supply | Qty: 30 | Fill #1

## 2016-06-01 MED FILL — DEXILANT DR 60 MG CAPSULE: 60 | 30 days supply | Qty: 30 | Fill #2

## 2016-06-23 ENCOUNTER — Encounter: Payer: Self-pay | Admitting: Adult Health

## 2016-06-23 ENCOUNTER — Ambulatory Visit (INDEPENDENT_AMBULATORY_CARE_PROVIDER_SITE_OTHER): Payer: 59 | Admitting: Adult Health

## 2016-06-23 VITALS — BP 127/87 | HR 77 | Ht 68.0 in | Wt 203.9 lb

## 2016-06-23 DIAGNOSIS — Z8673 Personal history of transient ischemic attack (TIA), and cerebral infarction without residual deficits: Secondary | ICD-10-CM

## 2016-06-23 DIAGNOSIS — K219 Gastro-esophageal reflux disease without esophagitis: Secondary | ICD-10-CM

## 2016-06-23 DIAGNOSIS — N529 Male erectile dysfunction, unspecified: Secondary | ICD-10-CM

## 2016-06-23 DIAGNOSIS — Z0000 Encounter for general adult medical examination without abnormal findings: Secondary | ICD-10-CM | POA: Diagnosis not present

## 2016-06-23 DIAGNOSIS — E559 Vitamin D deficiency, unspecified: Secondary | ICD-10-CM | POA: Diagnosis not present

## 2016-06-23 DIAGNOSIS — E785 Hyperlipidemia, unspecified: Secondary | ICD-10-CM | POA: Diagnosis not present

## 2016-06-23 DIAGNOSIS — F411 Generalized anxiety disorder: Secondary | ICD-10-CM | POA: Diagnosis not present

## 2016-06-23 DIAGNOSIS — Z131 Encounter for screening for diabetes mellitus: Secondary | ICD-10-CM | POA: Diagnosis not present

## 2016-06-23 DIAGNOSIS — R5383 Other fatigue: Secondary | ICD-10-CM | POA: Diagnosis not present

## 2016-06-23 LAB — POCT GLYCOSYLATED HEMOGLOBIN (HGB A1C): Hemoglobin A1C: 5.8

## 2016-06-23 MED ORDER — SERTRALINE HCL 100 MG PO TABS: 100.0000 mg | ORAL_TABLET | Freq: Every day | ORAL | 4 refills | Status: DC

## 2016-06-23 MED FILL — SERTRALINE HCL 100 MG TAB: 100 | 90 days supply | Qty: 90 | Fill #0

## 2016-06-23 NOTE — Assessment & Plan Note (Signed)
Continue to reduce saturated fat. Continue regular movement. Checked Lipid panel today.

## 2016-06-23 NOTE — Assessment & Plan Note (Signed)
A1c today 5.8

## 2016-06-23 NOTE — Patient Instructions (Signed)
Heart-Healthy Eating Plan Many factors influence your heart health, including eating and exercise habits. Heart (coronary) risk increases with abnormal blood fat (lipid) levels. Heart-healthy meal planning includes limiting unhealthy fats, increasing healthy fats, and making other small dietary changes. This includes maintaining a healthy body weight to help keep lipid levels within a normal range. What is my plan? Your health care provider recommends that you:  Get no more than _________% of the total calories in your daily diet from fat.  Limit your intake of saturated fat to less than _________% of your total calories each day.  Limit the amount of cholesterol in your diet to less than _________ mg per day. What types of fat should I choose?  Choose healthy fats more often. Choose monounsaturated and polyunsaturated fats, such as olive oil and canola oil, flaxseeds, walnuts, almonds, and seeds.  Eat more omega-3 fats. Good choices include salmon, mackerel, sardines, tuna, flaxseed oil, and ground flaxseeds. Aim to eat fish at least two times each week.  Limit saturated fats. Saturated fats are primarily found in animal products, such as meats, butter, and cream. Plant sources of saturated fats include palm oil, palm kernel oil, and coconut oil.  Avoid foods with partially hydrogenated oils in them. These contain trans fats. Examples of foods that contain trans fats are stick margarine, some tub margarines, cookies, crackers, and other baked goods. What general guidelines do I need to follow?  Check food labels carefully to identify foods with trans fats or high amounts of saturated fat.  Fill one half of your plate with vegetables and green salads. Eat 4-5 servings of vegetables per day. A serving of vegetables equals 1 cup of raw leafy vegetables,  cup of raw or cooked cut-up vegetables, or  cup of vegetable juice.  Fill one fourth of your plate with whole grains. Look for the word  "whole" as the first word in the ingredient list.  Fill one fourth of your plate with lean protein foods.  Eat 4-5 servings of fruit per day. A serving of fruit equals one medium whole fruit,  cup of dried fruit,  cup of fresh, frozen, or canned fruit, or  cup of 100% fruit juice.  Eat more foods that contain soluble fiber. Examples of foods that contain this type of fiber are apples, broccoli, carrots, beans, peas, and barley. Aim to get 20-30 g of fiber per day.  Eat more home-cooked food and less restaurant, buffet, and fast food.  Limit or avoid alcohol.  Limit foods that are high in starch and sugar.  Avoid fried foods.  Cook foods by using methods other than frying. Baking, boiling, grilling, and broiling are all great options. Other fat-reducing suggestions include:  Removing the skin from poultry.  Removing all visible fats from meats.  Skimming the fat off of stews, soups, and gravies before serving them.  Steaming vegetables in water or broth.  Lose weight if you are overweight. Losing just 5-10% of your initial body weight can help your overall health and prevent diseases such as diabetes and heart disease.  Increase your consumption of nuts, legumes, and seeds to 4-5 servings per week. One serving of dried beans or legumes equals  cup after being cooked, one serving of nuts equals 1 ounces, and one serving of seeds equals  ounce or 1 tablespoon.  You may need to monitor your salt (sodium) intake, especially if you have high blood pressure. Talk with your health care provider or dietitian to get more  information about reducing sodium. What foods can I eat? Grains   Breads, including [place], white, pita, wheat, raisin, rye, oatmeal, and [place]. Tortillas that are neither fried nor made with lard or trans fat. Low-fat rolls, including hotdog and hamburger buns and English muffins. Biscuits. Muffins. Waffles. Pancakes. Light popcorn. Whole-grain cereals. Flatbread.  Melba toast. Pretzels. Breadsticks. Rusks. Low-fat snacks and crackers, including oyster, saltine, matzo, graham, animal, and rye. Rice and pasta, including [place] rice and those that are made with whole wheat. Vegetables  All vegetables. Fruits  All fruits, but limit coconut. Meats and Other Protein Sources  Lean, well-trimmed beef, veal, pork, and lamb. Chicken and [place] without skin. All fish and shellfish. Wild duck, rabbit, pheasant, and venison. Egg whites or low-cholesterol egg substitutes. Dried beans, peas, lentils, and tofu.Seeds and most nuts. Dairy  Low-fat or nonfat cheeses, including ricotta, string, and mozzarella. Skim or 1% milk that is liquid, powdered, or evaporated. Buttermilk that is made with low-fat milk. Nonfat or low-fat yogurt. Beverages  Mineral water. Diet carbonated beverages. Sweets and Desserts  Sherbets and fruit ices. Honey, jam, marmalade, jelly, and syrups. Meringues and gelatins. Pure sugar candy, such as hard candy, jelly beans, gumdrops, mints, marshmallows, and small amounts of dark chocolate. [employer] Eat all sweets and desserts in moderation. Fats and Oils  Nonhydrogenated (trans-free) margarines. Vegetable oils, including soybean, sesame, sunflower, olive, peanut, safflower, corn, canola, and cottonseed. Salad dressings or mayonnaise that are made with a vegetable oil. Limit added fats and oils that you use for cooking, baking, salads, and as spreads. Other  Cocoa powder. Coffee and tea. All seasonings and condiments. The items listed above may not be a complete list of recommended foods or beverages. Contact your dietitian for more options.  What foods are not recommended? Grains  Breads that are made with saturated or trans fats, oils, or whole milk. Croissants. Butter rolls. Cheese breads. Sweet rolls. Donuts. Buttered popcorn. Chow mein noodles. High-fat crackers, such as cheese or butter crackers. Meats and Other Protein Sources  Fatty  meats, such as hotdogs, short ribs, sausage, spareribs, bacon, ribeye roast or steak, and mutton. High-fat deli meats, such as salami and bologna. Caviar. Domestic duck and goose. Organ meats, such as kidney, liver, sweetbreads, brains, gizzard, chitterlings, and heart. Dairy  Cream, sour cream, cream cheese, and creamed cottage cheese. Whole milk cheeses, including blue (bleu), Monte[place] Jack, [place], [place], American, [place], Swiss, [place], [place], and [place]. Whole or 2% milk that is liquid, evaporated, or condensed. Whole buttermilk. Cream sauce or high-fat cheese sauce. Yogurt that is made from whole milk. Beverages  Regular sodas and drinks with added sugar. Sweets and Desserts  Frosting. Pudding. Cookies. Cakes other than angel food cake. Candy that has milk chocolate or white chocolate, hydrogenated fat, butter, coconut, or unknown ingredients. Buttered syrups. Full-fat ice cream or ice cream drinks. Fats and Oils  Gravy that has suet, meat fat, or shortening. Cocoa butter, hydrogenated oils, palm oil, coconut oil, palm kernel oil. These can often be found in baked products, candy, fried foods, nondairy creamers, and whipped toppings. Solid fats and shortenings, including bacon fat, salt pork, lard, and butter. Nondairy cream substitutes, such as coffee creamers and sour cream substitutes. Salad dressings that are made of unknown oils, cheese, or sour cream. The items listed above may not be a complete list of foods and beverages to avoid. Contact your dietitian for more information.  This information is not intended to replace advice given to you by  your health care provider. Make sure you discuss any questions you have with your health care provider. Document Released: 12/07/2007 Document Revised: 09/17/2015 Document Reviewed: 08/21/2013 Elsevier Interactive Patient Education  2017 [employer].   We will call when lab results are available. Please increase daily water intake, recommend  at least 100 ounces daily. Continue all medications as directed. Re-started Sertraline 100mg  daily. Annual follow-up, sooner if needed.

## 2016-06-23 NOTE — Assessment & Plan Note (Signed)
Re-started on Sertraline 100mg  daily. Been taking >10 years, denies SE. Denies thoughts of harming himself/others.

## 2016-06-23 NOTE — Assessment & Plan Note (Signed)
Checked Vit D level today 

## 2016-06-23 NOTE — Assessment & Plan Note (Signed)
TIA at age 51. Has been on Plavix 75mg  daily since then, will continue. Denies HA/dizziness/tremor

## 2016-06-23 NOTE — Assessment & Plan Note (Signed)
We will call when lab results are available. Please increase daily water intake, recommend at least 100 ounces daily. Continue all medications as directed. Re-started Sertraline 100mg  daily. Annual follow-up, sooner if needed.

## 2016-06-23 NOTE — Progress Notes (Signed)
Subjective:    Patient ID: Zachary Massey, male    DOB: 06/27/1965, 51 y.o.   MRN: [id number]  HPI:  Zachary Massey is here is to establish as a new pt.  He is a very pleasant 51 year old male.  PMH:  GERD, TIA , ED, GA, Hypogonadism.  He is compliant on medications and denies SE.  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  He recently stopped drinking mountain dew and is trying to increae daily water intake.  He denies tobacco or ETOH use.  He would like to re-start sertraline since it helps "take the edge off".  He is happily married to a wonderful ,hard working woman and has two teenage twins-49 years old.  Patient Care Team    Relationship Specialty Notifications Start End  Odella Aquas, NP PCP - General Family Medicine  06/15/16   Garvin Fila, MD Consulting Physician Neurology  06/16/16   Ronald Lobo, MD Consulting Physician Gastroenterology  06/16/16   Kathie Rhodes, MD Consulting Physician Urology  06/16/16     Patient Active Problem List   Diagnosis Date Noted  . Vitamin D deficiency 06/23/2016  . Hyperlipidemia 06/23/2016  . Screening for diabetes mellitus 06/23/2016  . Fatigue 06/23/2016  . Health care maintenance 06/23/2016  . Ganglion cyst of wrist, left 02/25/2016  . Memory loss 02/03/2015  . MCI (mild cognitive impairment) 02/03/2015  . Erectile dysfunction 07/13/2013  . Hypogonadism male 07/13/2013  . Generalized anxiety disorder 07/13/2013  . Premature ejaculation 07/13/2013  . Ichthyosis 04/09/2012  . GERD (gastroesophageal reflux disease) 12/03/2011  . History of anemia 12/01/2011  . History of TIA (transient ischemic attack) 12/01/2011     Past Medical History:  Diagnosis Date  . Anxiety   . Depression   . Erectile dysfunction   . GERD (gastroesophageal reflux disease)   . GERD (gastroesophageal reflux disease)   . Hypogonadism male   . Premature ejaculation   . Stroke ([place]) 03/13/2006   TIA (aphasia; admitted; Plavix therapy; maintained on  Plavix.  Marland Kitchen TIA (transient ischemic attack)      Past Surgical History:  Procedure Laterality Date  . VASECTOMY       Family History  Problem Relation Age of Onset  . Hyperlipidemia Mother   . Cancer Paternal Grandfather     stomach, esophageal  . Heart attack Maternal Grandmother   . Stroke Paternal Grandmother   . Dementia Paternal Grandmother      History  Drug Use No     History  Alcohol Use No     History  Smoking Status  . Never Smoker  Smokeless Tobacco  . Never Used     Outpatient Encounter Prescriptions as of 06/23/2016  Medication Sig  . Cholecalciferol (VITAMIN D3) 10000 units capsule Take 10,000 Units by mouth daily.  . clopidogrel (PLAVIX) 75 MG tablet Take 1 tablet (75 mg total) by mouth daily.  . COCONUT OIL PO Take by mouth. Take 3 capsule a day  . dexlansoprazole (DEXILANT) 60 MG capsule Take 60 mg by mouth daily.  Marland Kitchen ibuprofen (ADVIL,MOTRIN) 200 MG tablet Take 800 mg by mouth daily as needed for headache.  . sertraline (ZOLOFT) 100 MG tablet Take 1 tablet (100 mg total) by mouth daily.  . [DISCONTINUED] sertraline (ZOLOFT) 100 MG tablet Take 1.5 tablets (150 mg total) by mouth daily.  . [DISCONTINUED] amoxicillin-clavulanate (AUGMENTIN) 875-125 MG tablet Take 1 tablet by mouth 2 (two) times daily.  . [  DISCONTINUED] chlorpheniramine-HYDROcodone (TUSSIONEX PENNKINETIC ER) 10-8 MG/5ML SUER Take 5 mLs by mouth every 12 (twelve) hours as needed.   No facility-administered encounter medications on file as of 06/23/2016.     Allergies: Patient has no known allergies.  Body mass index is 31 kg/m.  Blood pressure 127/87, pulse 77, height 5\' 8"  (1.727 m), weight 203 lb 14.4 oz (92.5 kg).     Review of Systems  Constitutional: Positive for fatigue. Negative for activity change, appetite change, chills, diaphoresis, fever and unexpected weight change.  HENT: Negative for congestion.   Eyes: Negative for visual disturbance.  Respiratory: Negative  for cough, chest tightness, shortness of breath, wheezing and stridor.   Cardiovascular: Negative for chest pain, palpitations and leg swelling.  Gastrointestinal: Negative for abdominal distention, abdominal pain, blood in stool, constipation, diarrhea, nausea and vomiting.  Endocrine: Negative for cold intolerance, heat intolerance, polydipsia, polyphagia and polyuria.  Genitourinary: Negative for difficulty urinating, flank pain and hematuria.  Musculoskeletal: Positive for back pain. Negative for arthralgias, gait problem, joint swelling, myalgias, neck pain and neck stiffness.  Skin: Negative for color change, pallor, rash and wound.  Allergic/Immunologic: Negative for immunocompromised state.  Neurological: Negative for dizziness, tremors, facial asymmetry, weakness, light-headedness and headaches.  Hematological: Does not bruise/bleed easily.  Psychiatric/Behavioral: Positive for dysphoric mood. Negative for agitation, behavioral problems, confusion, decreased concentration, hallucinations, self-injury, sleep disturbance and suicidal ideas. The patient is nervous/anxious. The patient is not hyperactive.        Objective:   Physical Exam  Constitutional: He is oriented to person, place, and time. He appears well-developed and well-nourished. No distress.  HENT:  Head: Normocephalic and atraumatic.  Right Ear: Hearing, tympanic membrane and external ear normal. No decreased hearing is noted.  Left Ear: Tympanic membrane and external ear normal. No decreased hearing is noted.  Nose: Nose normal. Right sinus exhibits no maxillary sinus tenderness and no frontal sinus tenderness. Left sinus exhibits no maxillary sinus tenderness and no frontal sinus tenderness.  Mouth/Throat: Uvula is midline.  Eyes: Conjunctivae are normal. Pupils are equal, round, and reactive to light.  Neck: Normal range of motion. Neck supple.  Cardiovascular: Normal rate, regular rhythm, normal heart sounds and  intact distal pulses.   No murmur heard. Pulmonary/Chest: Effort normal and breath sounds normal. No respiratory distress. He has no wheezes. He has no rales. He exhibits no tenderness.  Abdominal: Soft. Bowel sounds are normal. He exhibits no distension and no mass. There is no tenderness. There is no rebound and no guarding.  Musculoskeletal: Normal range of motion. He exhibits no edema.  Lymphadenopathy:    He has no cervical adenopathy.  Neurological: He is alert and oriented to person, place, and time. No cranial nerve deficit. He exhibits normal muscle tone. Coordination normal.  Skin: Skin is warm and dry. No rash noted. He is not diaphoretic. No erythema. No pallor.  Psychiatric: He has a normal mood and affect. His behavior is normal. Judgment and thought content normal.  Nursing note and vitals reviewed.         Assessment & Plan:   1. Vitamin D deficiency   2. Fatigue, unspecified type   3. Hyperlipidemia, unspecified hyperlipidemia type   4. Screening for diabetes mellitus   5. Erectile dysfunction, unspecified erectile dysfunction type   6. Health care maintenance   7. History of TIA (transient ischemic attack)   8. Generalized anxiety disorder   9. Gastroesophageal reflux disease, esophagitis presence not specified     Health  care maintenance We will call when lab results are available. Please increase daily water intake, recommend at least 100 ounces daily. Continue all medications as directed. Re-started Sertraline 100mg  daily. Annual follow-up, sooner if needed.  Screening for diabetes mellitus A1c today 5.8  Hyperlipidemia Continue to reduce saturated fat. Continue regular movement. Checked Lipid panel today.  History of TIA (transient ischemic attack) TIA at age 15. Has been on Plavix 75mg  daily since then, will continue. Denies HA/dizziness/tremor  Generalized anxiety disorder Re-started on Sertraline 100mg  daily. Been taking >10 years, denies  SE. Denies thoughts of harming himself/others.  Vitamin D deficiency Checked Vit D level today.  GERD (gastroesophageal reflux disease) Continue dexlansoprazole 60mg  daily. Has GI appt 06/26/16 Avoid acidic/spicy foods. Do not eat close to bedtime.    FOLLOW-UP:  Return in about 1 year (around 06/23/2017) for Regular Follow Up.

## 2016-06-23 NOTE — Assessment & Plan Note (Addendum)
Continue dexlansoprazole 60mg  daily. Has GI appt 06/26/16 Avoid acidic/spicy foods. Do not eat close to bedtime.

## 2016-06-24 LAB — COMPREHENSIVE METABOLIC PANEL
A/G RATIO: 1.6 (ref 1.2–2.2)
ALT: 24 IU/L (ref 0–44)
AST: 18 IU/L (ref 0–40)
Albumin: 4.5 g/dL (ref 3.5–5.5)
Alkaline Phosphatase: 84 IU/L (ref 39–117)
BUN/Creatinine Ratio: 18 (ref 9–20)
BUN: 15 mg/dL (ref 6–24)
Bilirubin Total: 0.5 mg/dL (ref 0.0–1.2)
CHLORIDE: 101 mmol/L (ref 96–106)
CO2: 24 mmol/L (ref 18–29)
Calcium: 9.2 mg/dL (ref 8.7–10.2)
Creatinine, Ser: 0.82 mg/dL (ref 0.76–1.27)
GFR calc non Af Amer: 105 mL/min/{1.73_m2} (ref >59)
GFR, EST AFRICAN AMERICAN: 121 mL/min/{1.73_m2} (ref >59)
GLOBULIN, TOTAL: 2.8 g/dL (ref 1.5–4.5)
Glucose: 96 mg/dL (ref 65–99)
Potassium: 4.3 mmol/L (ref 3.5–5.2)
SODIUM: 141 mmol/L (ref 134–144)
Total Protein: 7.3 g/dL (ref 6.0–8.5)

## 2016-06-24 LAB — CBC WITH DIFFERENTIAL/PLATELET
BASOS: 1 %
Basophils Absolute: 0.1 10*3/uL (ref 0.0–0.2)
EOS (ABSOLUTE): 0.3 10*3/uL (ref 0.0–0.4)
Eos: 3 %
HEMATOCRIT: 43 % (ref 37.5–51.0)
Hemoglobin: 14.7 g/dL (ref 13.0–17.7)
IMMATURE GRANULOCYTES: 0 %
Immature Grans (Abs): 0 10*3/uL (ref 0.0–0.1)
LYMPHS ABS: 2.2 10*3/uL (ref 0.7–3.1)
Lymphs: 27 %
MCH: 25.7 pg — AB (ref 26.6–33.0)
MCHC: 34.2 g/dL (ref 31.5–35.7)
MCV: 75 fL — AB (ref 79–97)
MONOS ABS: 0.7 10*3/uL (ref 0.1–0.9)
Monocytes: 8 %
NEUTROS PCT: 61 %
Neutrophils Absolute: 4.9 10*3/uL (ref 1.4–7.0)
PLATELETS: 262 10*3/uL (ref 150–379)
RBC: 5.72 x10E6/uL (ref 4.14–5.80)
RDW: 15.1 % (ref 12.3–15.4)
WBC: 8.2 10*3/uL (ref 3.4–10.8)

## 2016-06-24 LAB — TSH: TSH: 3.08 u[IU]/mL (ref 0.450–4.500)

## 2016-06-24 LAB — LIPID PANEL
CHOL/HDL RATIO: 8.4 ratio — AB (ref 0.0–5.0)
Cholesterol, Total: 202 mg/dL — ABNORMAL HIGH (ref 100–199)
HDL: 24 mg/dL — ABNORMAL LOW (ref >39)
Triglycerides: 695 mg/dL (ref 0–149)

## 2016-06-24 LAB — VITAMIN D 25 HYDROXY (VIT D DEFICIENCY, FRACTURES): Vit D, 25-Hydroxy: 53.2 ng/mL (ref 30.0–100.0)

## 2016-06-26 ENCOUNTER — Other Ambulatory Visit: Payer: Self-pay | Admitting: Adult Health

## 2016-06-26 DIAGNOSIS — E785 Hyperlipidemia, unspecified: Secondary | ICD-10-CM

## 2016-06-26 DIAGNOSIS — K219 Gastro-esophageal reflux disease without esophagitis: Secondary | ICD-10-CM | POA: Diagnosis not present

## 2016-06-26 MED ORDER — NIACIN ER 250 MG PO CPCR: 250.0000 mg | ORAL_CAPSULE | Freq: Every day | ORAL | 0 refills | Status: DC

## 2016-07-06 MED FILL — CLOPIDOGREL 75 MG TABLET: 75 | 90 days supply | Qty: 90 | Fill #3

## 2016-07-06 MED FILL — DEXILANT DR 60 MG CAPSULE: 60 | 30 days supply | Qty: 30 | Fill #0

## 2016-08-10 MED FILL — DEXILANT DR 60 MG CAPSULE: 60 | 30 days supply | Qty: 30 | Fill #1

## 2016-09-07 MED FILL — DEXILANT DR 60 MG CAPSULE: 60 | 30 days supply | Qty: 30 | Fill #2

## 2016-10-11 ENCOUNTER — Encounter: Payer: Self-pay | Admitting: Adult Health

## 2016-10-11 ENCOUNTER — Ambulatory Visit (INDEPENDENT_AMBULATORY_CARE_PROVIDER_SITE_OTHER): Payer: 59 | Admitting: Adult Health

## 2016-10-11 VITALS — BP 134/74 | HR 57 | Ht 68.0 in | Wt 194.9 lb

## 2016-10-11 DIAGNOSIS — Z131 Encounter for screening for diabetes mellitus: Secondary | ICD-10-CM | POA: Diagnosis not present

## 2016-10-11 DIAGNOSIS — F411 Generalized anxiety disorder: Secondary | ICD-10-CM | POA: Diagnosis not present

## 2016-10-11 DIAGNOSIS — E785 Hyperlipidemia, unspecified: Secondary | ICD-10-CM | POA: Diagnosis not present

## 2016-10-11 DIAGNOSIS — N529 Male erectile dysfunction, unspecified: Secondary | ICD-10-CM | POA: Diagnosis not present

## 2016-10-11 DIAGNOSIS — E7800 Pure hypercholesterolemia, unspecified: Secondary | ICD-10-CM

## 2016-10-11 MED ORDER — NIACIN ER 500 MG PO CPCR: 500.0000 mg | ORAL_CAPSULE | Freq: Every day | ORAL | 0 refills | Status: DC

## 2016-10-11 MED ORDER — ATORVASTATIN CALCIUM 10 MG PO TABS: 10.0000 mg | ORAL_TABLET | Freq: Every day | ORAL | 3 refills | Status: DC

## 2016-10-11 MED FILL — ATORVASTATIN 10 MG TABLET: 10 | 90 days supply | Qty: 90 | Fill #0

## 2016-10-11 NOTE — Progress Notes (Signed)
Subjective:    Patient ID: Zachary Massey, male    DOB: 1965/04/02, 51 y.o.   MRN: [id number]  HPI:  Zachary Massey is here to discuss ED.  He has long standing hx of hypogonadism and premature ejaculation-since late teens.  He was even in study in his early 35s for premature ejaculation.  He was treated with Testosterone injections via Urologist for years, however he feels that it did not improve the above mentioned conditions.   When intercourse is initiated it is mildly difficult to achieve erection and he reports "quick finishing".  He denies CP/palpitations/dizziness/HA/change in vision.  He has been sporadically taking Niacin 1000mg  QHS, however really has been trying to reduce saturated fat/CHO intake. 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 Wife at [employer] during encounter.  Patient Care Team    Relationship Specialty Notifications Start End  Mina Marble D, NP PCP - General Family Medicine  06/15/16   Garvin Fila, MD Consulting Physician Neurology  06/16/16   Ronald Lobo, MD Consulting Physician Gastroenterology  06/16/16   Kathie Rhodes, MD Consulting Physician Urology  06/16/16     Patient Active Problem List   Diagnosis Date Noted  . Vitamin D deficiency 06/23/2016  . Hyperlipidemia 06/23/2016  . Screening for diabetes mellitus 06/23/2016  . Fatigue 06/23/2016  . Health care maintenance 06/23/2016  . Ganglion cyst of wrist, left 02/25/2016  . Memory loss 02/03/2015  . MCI (mild cognitive impairment) 02/03/2015  . Erectile dysfunction 07/13/2013  . Hypogonadism male 07/13/2013  . Generalized anxiety disorder 07/13/2013  . Premature ejaculation 07/13/2013  . Ichthyosis 04/09/2012  . GERD (gastroesophageal reflux disease) 12/03/2011  . History of anemia 12/01/2011  . History of TIA (transient ischemic attack) 12/01/2011     Past Medical History:  Diagnosis Date  . Anxiety   . Depression   . Erectile dysfunction    . GERD (gastroesophageal reflux disease)   . GERD (gastroesophageal reflux disease)   . Hypogonadism male   . Premature ejaculation   . Stroke ([place]) 03/13/2006   TIA (aphasia; admitted; Plavix therapy; maintained on Plavix.  Marland Kitchen TIA (transient ischemic attack)      Past Surgical History:  Procedure Laterality Date  . VASECTOMY       Family History  Problem Relation Age of Onset  . Hyperlipidemia Mother   . Cancer Paternal Grandfather        stomach, esophageal  . Heart attack Maternal Grandmother   . Stroke Paternal Grandmother   . Dementia Paternal Grandmother      History  Drug Use No     History  Alcohol Use No     History  Smoking Status  . Never Smoker  Smokeless Tobacco  . Never Used     Outpatient Encounter Prescriptions as of 10/11/2016  Medication Sig  . Cholecalciferol (VITAMIN D3) 10000 units capsule Take 10,000 Units by mouth daily.  . clopidogrel (PLAVIX) 75 MG tablet Take 1 tablet (75 mg total) by mouth daily.  . COCONUT OIL PO Take by mouth. Take 3 capsule a day  . dexlansoprazole (DEXILANT) 60 MG capsule Take 60 mg by mouth daily.  . niacin 500 MG CR capsule Take 1 capsule (500 mg total) by mouth at bedtime. 2 tabs QHS  . sertraline (ZOLOFT) 100 MG tablet Take 1 tablet (100 mg total) by mouth daily.  . [DISCONTINUED] niacin 250 MG CR capsule Take 1 capsule (250 mg total) by  mouth at bedtime. One tablet by mouth week one. Two tablets by mouth week two. Three tablets by mouth week three. Two tablets am/pm week four.  Marland Kitchen atorvastatin (LIPITOR) 10 MG tablet Take 1 tablet (10 mg total) by mouth daily.  . [DISCONTINUED] ibuprofen (ADVIL,MOTRIN) 200 MG tablet Take 800 mg by mouth daily as needed for headache.   No facility-administered encounter medications on file as of 10/11/2016.     Allergies: Patient has no known allergies.  Body mass index is 29.63 kg/m.  Blood pressure 134/74, pulse (!) 57, height 5\' 8"  (1.727 m), weight 194 lb 14.4 oz  (88.4 kg).    Review of Systems  Constitutional: Positive for fatigue. Negative for activity change, appetite change, chills, diaphoresis, fever and unexpected weight change.  Eyes: Negative for visual disturbance.  Respiratory: Negative for cough, chest tightness, shortness of breath, wheezing and stridor.   Cardiovascular: Negative for chest pain, palpitations and leg swelling.  Endocrine: Negative for cold intolerance, heat intolerance, polydipsia, polyphagia and polyuria.  Genitourinary: Negative for difficulty urinating and flank pain.  Skin: Negative for color change, pallor, rash and wound.  Allergic/Immunologic: Negative for immunocompromised state.  Neurological: Negative for dizziness, tremors, weakness and headaches.  Hematological: Does not bruise/bleed easily.  Psychiatric/Behavioral: Negative for dysphoric mood and sleep disturbance. The patient is nervous/anxious.        Objective:   Physical Exam  Constitutional: He is oriented to person, place, and time. He appears well-developed and well-nourished. No distress.  Cardiovascular: Normal rate, regular rhythm, normal heart sounds and intact distal pulses.   No murmur heard. Pulmonary/Chest: Effort normal. No respiratory distress. He has no wheezes. He has no rales. He exhibits no tenderness.  Neurological: He is alert and oriented to person, place, and time.  Skin: Skin is warm and dry. No rash noted. He is not diaphoretic. No erythema. No pallor.  Psychiatric: He has a normal mood and affect. His behavior is normal. Judgment and thought content normal.  Nursing note and vitals reviewed.         Assessment & Plan:   1. Elevated cholesterol   2. Hyperlipidemia, unspecified hyperlipidemia type   3. Screening for diabetes mellitus   4. Erectile dysfunction, unspecified erectile dysfunction type     Hyperlipidemia 06/23/16 AST 18 ALT 24 Denies any EOTH or excessive Acetaminophen Discussed at length the  importance of taking medications as directed. Continue Niacin 500mg  2 tabs QHS Start Atorvastatin Will re-check hepatic function in 4-6 weeks and lipids in 3 months. Continue heart healthy diet and continue regular movement. If lipids are improved in 3 months will then discuss ED medication.  Screening for diabetes mellitus 06/23/16 A1c 5.8  Erectile dysfunction 06/23/16 TG 695 Extremely elevated TGs may be contributing to ED. He has been sporadic at best with nightly Niacin but he has been following heart healthy diet. 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   BP at goal today 134/74.     FOLLOW-UP:  Return in about 4 weeks (around 11/08/2016) for Nurse Visit-Labs.

## 2016-10-11 NOTE — Assessment & Plan Note (Signed)
06/23/16 A1c 5.8

## 2016-10-11 NOTE — Assessment & Plan Note (Signed)
Well controlled on sertraline 100mg  daily, declined need for RF today.

## 2016-10-11 NOTE — Assessment & Plan Note (Addendum)
06/23/16 TG 695 Extremely elevated TGs may be contributing to ED. He has been sporadic at best with nightly Niacin but he has been following heart healthy diet. 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   BP at goal today 134/74.

## 2016-10-11 NOTE — Assessment & Plan Note (Addendum)
06/23/16 AST 18 ALT 24 Denies any EOTH or excessive Acetaminophen Discussed at length the importance of taking medications as directed. Continue Niacin 500mg  2 tabs QHS Start Atorvastatin Will re-check hepatic function in 4-6 weeks and lipids in 3 months. Continue heart healthy diet and continue regular movement. If lipids are improved in 3 months will then discuss ED medication.

## 2016-10-11 NOTE — Patient Instructions (Addendum)
Heart-Healthy Eating Plan Many factors influence your heart health, including eating and exercise habits. Heart (coronary) risk increases with abnormal blood fat (lipid) levels. Heart-healthy meal planning includes limiting unhealthy fats, increasing healthy fats, and making other small dietary changes. This includes maintaining a healthy body weight to help keep lipid levels within a normal range. What is my plan? Your health care provider recommends that you:  Get no more than _________% of the total calories in your daily diet from fat.  Limit your intake of saturated fat to less than _________% of your total calories each day.  Limit the amount of cholesterol in your diet to less than _________ mg per day.  What types of fat should I choose?  Choose healthy fats more often. Choose monounsaturated and polyunsaturated fats, such as olive oil and canola oil, flaxseeds, walnuts, almonds, and seeds.  Eat more omega-3 fats. Good choices include salmon, mackerel, sardines, tuna, flaxseed oil, and ground flaxseeds. Aim to eat fish at least two times each week.  Limit saturated fats. Saturated fats are primarily found in animal products, such as meats, butter, and cream. Plant sources of saturated fats include palm oil, palm kernel oil, and coconut oil.  Avoid foods with partially hydrogenated oils in them. These contain trans fats. Examples of foods that contain trans fats are stick margarine, some tub margarines, cookies, crackers, and other baked goods. What general guidelines do I need to follow?  Check food labels carefully to identify foods with trans fats or high amounts of saturated fat.  Fill one half of your plate with vegetables and green salads. Eat 4-5 servings of vegetables per day. A serving of vegetables equals 1 cup of raw leafy vegetables,  cup of raw or cooked cut-up vegetables, or  cup of vegetable juice.  Fill one fourth of your plate with whole grains. Look for the word  "whole" as the first word in the ingredient list.  Fill one fourth of your plate with lean protein foods.  Eat 4-5 servings of fruit per day. A serving of fruit equals one medium whole fruit,  cup of dried fruit,  cup of fresh, frozen, or canned fruit, or  cup of 100% fruit juice.  Eat more foods that contain soluble fiber. Examples of foods that contain this type of fiber are apples, broccoli, carrots, beans, peas, and barley. Aim to get 20-30 g of fiber per day.  Eat more home-cooked food and less restaurant, buffet, and fast food.  Limit or avoid alcohol.  Limit foods that are high in starch and sugar.  Avoid fried foods.  Cook foods by using methods other than frying. Baking, boiling, grilling, and broiling are all great options. Other fat-reducing suggestions include: ? Removing the skin from poultry. ? Removing all visible fats from meats. ? Skimming the fat off of stews, soups, and gravies before serving them. ? Steaming vegetables in water or broth.  Lose weight if you are overweight. Losing just 5-10% of your initial body weight can help your overall health and prevent diseases such as diabetes and heart disease.  Increase your consumption of nuts, legumes, and seeds to 4-5 servings per week. One serving of dried beans or legumes equals  cup after being cooked, one serving of nuts equals 1 ounces, and one serving of seeds equals  ounce or 1 tablespoon.  You may need to monitor your salt (sodium) intake, especially if you have high blood pressure. Talk with your health care provider or dietitian to get  more information about reducing sodium. What foods can I eat? Grains  Breads, including [place], white, pita, wheat, raisin, rye, oatmeal, and [place]. Tortillas that are neither fried nor made with lard or trans fat. Low-fat rolls, including hotdog and hamburger buns and English muffins. Biscuits. Muffins. Waffles. Pancakes. Light popcorn. Whole-grain cereals. Flatbread.  Melba toast. Pretzels. Breadsticks. Rusks. Low-fat snacks and crackers, including oyster, saltine, matzo, graham, animal, and rye. Rice and pasta, including brown rice and those that are made with whole wheat. Vegetables All vegetables. Fruits All fruits, but limit coconut. Meats and Other Protein Sources Lean, well-trimmed beef, veal, pork, and lamb. Chicken and [place] without skin. All fish and shellfish. Wild duck, rabbit, pheasant, and venison. Egg whites or low-cholesterol egg substitutes. Dried beans, peas, lentils, and tofu.Seeds and most nuts. Dairy Low-fat or nonfat cheeses, including ricotta, string, and mozzarella. Skim or 1% milk that is liquid, powdered, or evaporated. Buttermilk that is made with low-fat milk. Nonfat or low-fat yogurt. Beverages Mineral water. Diet carbonated beverages. Sweets and Desserts Sherbets and fruit ices. Honey, jam, marmalade, jelly, and syrups. Meringues and gelatins. Pure sugar candy, such as hard candy, jelly beans, gumdrops, mints, marshmallows, and small amounts of dark chocolate. [employer] Eat all sweets and desserts in moderation. Fats and Oils Nonhydrogenated (trans-free) margarines. Vegetable oils, including soybean, sesame, sunflower, olive, peanut, safflower, corn, canola, and cottonseed. Salad dressings or mayonnaise that are made with a vegetable oil. Limit added fats and oils that you use for cooking, baking, salads, and as spreads. Other Cocoa powder. Coffee and tea. All seasonings and condiments. The items listed above may not be a complete list of recommended foods or beverages. Contact your dietitian for more options. What foods are not recommended? Grains Breads that are made with saturated or trans fats, oils, or whole milk. Croissants. Butter rolls. Cheese breads. Sweet rolls. Donuts. Buttered popcorn. Chow mein noodles. High-fat crackers, such as cheese or butter crackers. Meats and Other Protein Sources Fatty meats, such  as hotdogs, short ribs, sausage, spareribs, bacon, ribeye roast or steak, and mutton. High-fat deli meats, such as salami and bologna. Caviar. Domestic duck and goose. Organ meats, such as kidney, liver, sweetbreads, brains, gizzard, chitterlings, and heart. Dairy Cream, sour cream, cream cheese, and creamed cottage cheese. Whole milk cheeses, including blue (bleu), [place], [place], [place], [place], [place], [place], [place], [place], and [place]. Whole or 2% milk that is liquid, evaporated, or condensed. Whole buttermilk. Cream sauce or high-fat cheese sauce. Yogurt that is made from whole milk. Beverages Regular sodas and drinks with added sugar. Sweets and Desserts Frosting. Pudding. Cookies. Cakes other than angel food cake. Candy that has milk chocolate or white chocolate, hydrogenated fat, butter, coconut, or unknown ingredients. Buttered syrups. Full-fat ice cream or ice cream drinks. Fats and Oils Gravy that has suet, meat fat, or shortening. Cocoa butter, hydrogenated oils, palm oil, coconut oil, palm kernel oil. These can often be found in baked products, candy, fried foods, nondairy creamers, and whipped toppings. Solid fats and shortenings, including bacon fat, salt pork, lard, and butter. Nondairy cream substitutes, such as coffee creamers and sour cream substitutes. Salad dressings that are made of unknown oils, cheese, or sour cream. The items listed above may not be a complete list of foods and beverages to avoid. Contact your dietitian for more information. This information is not intended to replace advice given to you by your health care provider. Make sure you discuss any questions you have with your health care  provider. Document Released: 12/07/2007 Document Revised: 09/17/2015 Document Reviewed: 08/21/2013 Elsevier Interactive Patient Education  2017 [place].  Atorvastatin tablets What is this medicine? ATORVASTATIN (a TORE va sta tin) is known as a HMG-CoA reductase  inhibitor or 'statin'. It lowers the level of cholesterol and triglycerides in the blood. This drug may also reduce the risk of heart attack, stroke, or other health problems in patients with risk factors for heart disease. Diet and lifestyle changes are often used with this drug. This medicine may be used for other purposes; ask your health care provider or pharmacist if you have questions. COMMON BRAND NAME(S): Lipitor What should I tell my health care provider before I take this medicine? They need to know if you have any of these conditions: -frequently drink alcoholic beverages -history of stroke, TIA -kidney disease -liver disease -muscle aches or weakness -other medical condition -an unusual or allergic reaction to atorvastatin, other medicines, foods, dyes, or preservatives -pregnant or trying to get pregnant -breast-feeding How should I use this medicine? Take this medicine by mouth with a glass of water. Follow the directions on the prescription label. You can take this medicine with or without food. Take your doses at regular intervals. Do not take your medicine more often than directed. Talk to your pediatrician regarding the use of this medicine in children. While this drug may be prescribed for children as young as 16 years old for selected conditions, precautions do apply. Overdosage: If you think you have taken too much of this medicine contact a poison control center or emergency room at once. NOTE: This medicine is only for you. Do not share this medicine with others. What if I miss a dose? If you miss a dose, take it as soon as you can. If it is almost time for your next dose, take only that dose. Do not take double or extra doses. What may interact with this medicine? Do not take this medicine with any of the following medications: -red yeast rice -telaprevir -telithromycin -voriconazole This medicine may also interact with the following  medications: -alcohol -antiviral medicines for HIV or AIDS -boceprevir -certain antibiotics like clarithromycin, erythromycin, troleandomycin -certain medicines for cholesterol like fenofibrate or gemfibrozil -cimetidine -clarithromycin -colchicine -cyclosporine -digoxin -male hormones, like estrogens or progestins and birth control pills -grapefruit juice -medicines for fungal infections like fluconazole, itraconazole, ketoconazole -niacin -rifampin -spironolactone This list may not describe all possible interactions. Give your health care provider a list of all the medicines, herbs, non-prescription drugs, or dietary supplements you use. Also tell them if you smoke, drink alcohol, or use illegal drugs. Some items may interact with your medicine. What should I watch for while using this medicine? Visit your doctor or health care professional for regular check-ups. You may need regular tests to make sure your liver is working properly. Tell your doctor or health care professional right away if you get any unexplained muscle pain, tenderness, or weakness, especially if you also have a fever and tiredness. Your doctor or health care professional may tell you to stop taking this medicine if you develop muscle problems. If your muscle problems do not go away after stopping this medicine, contact your health care professional. This drug is only part of a total heart-health program. Your doctor or a dietician can suggest a low-cholesterol and low-fat diet to help. Avoid alcohol and smoking, and keep a proper exercise schedule. Do not use this drug if you are pregnant or breast-feeding. Serious side effects to an unborn  child or to an infant are possible. Talk to your doctor or pharmacist for more information. This medicine may affect blood sugar levels. If you have diabetes, check with your doctor or health care professional before you change your diet or the dose of your diabetic medicine. If  you are going to have surgery tell your health care professional that you are taking this drug. What side effects may I notice from receiving this medicine? Side effects that you should report to your doctor or health care professional as soon as possible: -allergic reactions like skin rash, itching or hives, swelling of the face, lips, or tongue -dark urine -fever -joint pain -muscle cramps, pain -redness, blistering, peeling or loosening of the skin, including inside the mouth -trouble passing urine or change in the amount of urine -unusually weak or tired -yellowing of eyes or skin Side effects that usually do not require medical attention (report to your doctor or health care professional if they continue or are bothersome): -constipation -heartburn -stomach gas, pain, upset This list may not describe all possible side effects. Call your doctor for medical advice about side effects. You may report side effects to FDA at 1-800-FDA-1088. Where should I keep my medicine? Keep out of the reach of children. Store at room temperature between 20 to 25 degrees C (68 to 77 degrees F). Throw away any unused medicine after the expiration date. NOTE: This sheet is a summary. It may not cover all possible information. If you have questions about this medicine, talk to your doctor, pharmacist, or health care provider.  2018 Elsevier/Gold Standard (2011-01-17 66:44:03)   Dyslipidemia Dyslipidemia is an imbalance of waxy, fat-like substances (lipids) in the blood. The body needs lipids in small amounts. Dyslipidemia often involves a high level of cholesterol or triglycerides, which are types of lipids. Common forms of dyslipidemia include:  High levels of bad cholesterol (LDL cholesterol). LDL is the type of cholesterol that causes fatty deposits (plaques) to build up in the blood vessels that carry blood away from your heart (arteries).  Low levels of good cholesterol (HDL cholesterol). HDL  cholesterol is the type of cholesterol that protects against heart disease. High levels of HDL remove the LDL buildup from arteries.  High levels of triglycerides. Triglycerides are a fatty substance in the blood that is linked to a buildup of plaques in the arteries.  You can develop dyslipidemia because of the genes you are born with (primary dyslipidemia) or changes that occur during your life (secondary dyslipidemia), or as a side effect of certain medical treatments. What are the causes? Primary dyslipidemia is caused by changes (mutations) in genes that are passed down through families (inherited). These mutations cause several types of dyslipidemia. Mutations can result in disorders that make the body produce too much LDL cholesterol or triglycerides, or not enough HDL cholesterol. These disorders may lead to heart disease, arterial disease, or stroke at an early age. Causes of secondary dyslipidemia include certain lifestyle choices and diseases that lead to dyslipidemia, such as:  Eating a diet that is high in animal fat.  Not getting enough activity or exercise (having a sedentary lifestyle).  Having diabetes, kidney disease, liver disease, or thyroid disease.  Drinking large amounts of alcohol.  Using certain types of drugs.  What increases the risk? You may be at greater risk for dyslipidemia if you are an older man or if you are a woman who has gone through menopause. Other risk factors include:  Having a family history  of dyslipidemia.  Taking certain medicines, including birth control pills, steroids, some diuretics, beta-blockers, and some medicines forHIV.  Smoking cigarettes.  Eating a high-fat diet.  Drinking large amounts of alcohol.  Having certain medical conditions such as diabetes, polycystic ovary syndrome (PCOS), pregnancy, kidney disease, liver disease, or hypothyroidism.  Not exercising regularly.  Being overweight or obese with too much belly  fat.  What are the signs or symptoms? Dyslipidemia does not usually cause any symptoms. Very high lipid levels can cause fatty bumps under the skin (xanthomas) or a white or gray ring around the black center (pupil) of the eye. Very high triglyceride levels can cause inflammation of the pancreas (pancreatitis). How is this diagnosed? Your health care provider may diagnose dyslipidemia based on a routine blood test (fasting blood test). Because most people do not have symptoms of the condition, this blood testing (lipid profile) is done on adults age 19 and older and is repeated every 5 years. This test checks:  Total cholesterol. This is a measure of the total amount of cholesterol in your blood, including LDL cholesterol, HDL cholesterol, and triglycerides. A healthy number is below 200.  LDL cholesterol. The target number for LDL cholesterol is different for each person, depending on individual risk factors. For most people, a number below 100 is healthy. Ask your health care provider what your LDL cholesterol number should be.  HDL cholesterol. An HDL level of 60 or higher is best because it helps to protect against heart disease. A number below 107 for men or below 28 for women increases the risk for heart disease.  Triglycerides. A healthy triglyceride number is below 150.  If your lipid profile is abnormal, your health care provider may do other blood tests to get more information about your condition. How is this treated? Treatment depends on the type of dyslipidemia that you have and your other risk factors for heart disease and stroke. Your health care provider will have a target range for your lipid levels based on this information. For many people, treatment starts with lifestyle changes, such as diet and exercise. Your health care provider may recommend that you:  Get regular exercise.  Make changes to your diet.  Quit smoking if you smoke.  If diet changes and exercise do not  help you reach your goals, your health care provider may also prescribe medicine to lower lipids. The most commonly prescribed type of medicine lowers your LDL cholesterol (statin drug). If you have a high triglyceride level, your provider may prescribe another type of drug (fibrate) or an omega-3 fish oil supplement, or both. Follow these instructions at home:  Take over-the-counter and prescription medicines only as told by your health care provider. This includes supplements.  Get regular exercise. Start an aerobic exercise and strength training program as told by your health care provider. Ask your health care provider what activities are safe for you. Your health care provider may recommend: ? 30 minutes of aerobic activity 4-6 days a week. Brisk walking is an example of aerobic activity. ? Strength training 2 days a week.  Eat a healthy diet as told by your health care provider. This can help you reach and maintain a healthy weight, lower your LDL cholesterol, and raise your HDL cholesterol. It may help to work with a diet and nutrition specialist (dietitian) to make a plan that is right for you. Your dietitian or health care provider may recommend: ? Limiting your calories, if you are overweight. ? Eating  more fruits, vegetables, whole grains, fish, and lean meats. ? Limiting saturated fat, trans fat, and cholesterol.  Follow instructions from your health care provider or dietitian about eating or drinking restrictions.  Limit alcohol intake to no more than one drink per day for nonpregnant women and two drinks per day for men. One drink equals 12 oz of beer, 5 oz of wine, or 1 oz of hard liquor.  Do not use any products that contain nicotine or tobacco, such as cigarettes and e-cigarettes. If you need help quitting, ask your health care provider.  Keep all follow-up visits as told by your health care provider. This is important. Contact a health care provider if:  You are having  trouble sticking to your exercise or diet plan.  You are struggling to quit smoking or control your use of alcohol. Summary  Dyslipidemia is an imbalance of waxy, fat-like substances (lipids) in the blood. The body needs lipids in small amounts. Dyslipidemia often involves a high level of cholesterol or triglycerides, which are types of lipids.  Treatment depends on the type of dyslipidemia that you have and your other risk factors for heart disease and stroke.  For many people, treatment starts with lifestyle changes, such as diet and exercise. Your health care provider may also prescribe medicine to lower lipids. This information is not intended to replace advice given to you by your health care provider. Make sure you discuss any questions you have with your health care provider. Document Released: 03/04/2013 Document Revised: 10/25/2015 Document Reviewed: 10/25/2015 Elsevier Interactive Patient Education  2018 [place] excellent hydration and heart healthy diet. Continue regular movement Continue Niacin 500mg  2 tabs at bedtime. Start Atorvastatin 10mg  at bedtime. Will re-check hepatic function in 4-6 weeks and lipids in 3 months. Continue heart healthy diet and continue regular movement. If lipids are improved in 3 months will then discuss ED medication. GREAT TO SEE YOU!

## 2016-10-12 MED FILL — DEXILANT DR 60 MG CAPSULE: 60 | 30 days supply | Qty: 30 | Fill #3

## 2016-10-20 ENCOUNTER — Other Ambulatory Visit: Payer: Self-pay

## 2016-10-20 DIAGNOSIS — Z8673 Personal history of transient ischemic attack (TIA), and cerebral infarction without residual deficits: Secondary | ICD-10-CM

## 2016-10-20 MED ORDER — CLOPIDOGREL BISULFATE 75 MG PO TABS: 75.0000 mg | ORAL_TABLET | Freq: Every day | ORAL | 0 refills | Status: DC

## 2016-10-20 MED FILL — CLOPIDOGREL 75 MG TABLET: 75 | 90 days supply | Qty: 90 | Fill #0

## 2016-11-14 MED FILL — DEXILANT DR 60 MG CAPSULE: 60 | 30 days supply | Qty: 30 | Fill #4

## 2016-12-18 MED FILL — DEXILANT DR 60 MG CAPSULE: 60 | 30 days supply | Qty: 30 | Fill #5

## 2016-12-28 ENCOUNTER — Ambulatory Visit (INDEPENDENT_AMBULATORY_CARE_PROVIDER_SITE_OTHER): Payer: 59 | Admitting: Adult Health

## 2016-12-28 ENCOUNTER — Encounter: Payer: Self-pay | Admitting: Adult Health

## 2016-12-28 DIAGNOSIS — J209 Acute bronchitis, unspecified: Secondary | ICD-10-CM | POA: Diagnosis not present

## 2016-12-28 MED ORDER — HYDROCODONE-HOMATROPINE 5-1.5 MG PO TABS: 1.0000 | ORAL_TABLET | Freq: Two times a day (BID) | ORAL | 0 refills | Status: DC | PRN

## 2016-12-28 MED ORDER — HYDROCODONE-HOMATROPINE 5-1.5 MG/5ML PO SYRP: 5.0000 mL | ORAL_SOLUTION | Freq: Three times a day (TID) | ORAL | 0 refills | Status: DC | PRN

## 2016-12-28 MED ORDER — AZITHROMYCIN 250 MG PO TABS: ORAL_TABLET | ORAL | 0 refills | Status: DC

## 2016-12-28 NOTE — Patient Instructions (Signed)

## 2016-12-28 NOTE — Progress Notes (Addendum)
Subjective:    Patient ID: Zachary Massey, male    DOB: [birth year], 51 y.o.   MRN: [id number]  HPI:  Zachary Massey is here for productive cough (green mucus), extreme fatigue, chest tightness, and clear nasal drainage that all started about a week ago.  He denies CP/dyspnea/N/V/D/fever/night sweats.  He has been using OTC Mucinex without much sx relief. He denies tobacco use and estimates to drinks at 40-50 oz/water a day. Of Note:  Several family members have been ill with similar sx's over the last month.  Patient Care Team    Relationship Specialty Notifications Start End  Mina Marble D, NP PCP - General Family Medicine  06/15/16   Garvin Fila, MD Consulting Physician Neurology  06/16/16   Ronald Lobo, MD Consulting Physician Gastroenterology  06/16/16   Kathie Rhodes, MD Consulting Physician Urology  06/16/16     Patient Active Problem List   Diagnosis Date Noted  . Bronchitis, acute 12/28/2016  . Vitamin D deficiency 06/23/2016  . Hyperlipidemia 06/23/2016  . Screening for diabetes mellitus 06/23/2016  . Fatigue 06/23/2016  . Health care maintenance 06/23/2016  . Ganglion cyst of wrist, left 02/25/2016  . Memory loss 02/03/2015  . MCI (mild cognitive impairment) 02/03/2015  . Erectile dysfunction 07/13/2013  . Hypogonadism male 07/13/2013  . Generalized anxiety disorder 07/13/2013  . Premature ejaculation 07/13/2013  . Ichthyosis 04/09/2012  . GERD (gastroesophageal reflux disease) 12/03/2011  . History of anemia 12/01/2011  . History of TIA (transient ischemic attack) 12/01/2011     Past Medical History:  Diagnosis Date  . Anxiety   . Depression   . Erectile dysfunction   . GERD (gastroesophageal reflux disease)   . GERD (gastroesophageal reflux disease)   . Hypogonadism male   . Premature ejaculation   . Stroke ([place]) 03/13/2006   TIA (aphasia; admitted; Plavix therapy; maintained on Plavix.  Marland Kitchen TIA (transient ischemic attack)      Past Surgical History:   Procedure Laterality Date  . VASECTOMY       Family History  Problem Relation Age of Onset  . Hyperlipidemia Mother   . Cancer Paternal Grandfather        stomach, esophageal  . Heart attack Maternal Grandmother   . Stroke Paternal Grandmother   . Dementia Paternal Grandmother      History  Drug Use No     History  Alcohol Use No     History  Smoking Status  . Never Smoker  Smokeless Tobacco  . Never Used     Outpatient Encounter Prescriptions as of 12/28/2016  Medication Sig  . atorvastatin (LIPITOR) 10 MG tablet Take 1 tablet (10 mg total) by mouth daily.  . Cholecalciferol (VITAMIN D3) 10000 units capsule Take 10,000 Units by mouth daily.  . clopidogrel (PLAVIX) 75 MG tablet Take 1 tablet (75 mg total) by mouth daily.  . COCONUT OIL PO Take by mouth. Take 3 capsule a day  . dexlansoprazole (DEXILANT) 60 MG capsule Take 60 mg by mouth daily.  . niacin 500 MG CR capsule Take 1 capsule (500 mg total) by mouth at bedtime. 2 tabs QHS  . sertraline (ZOLOFT) 100 MG tablet Take 1 tablet (100 mg total) by mouth daily.  Marland Kitchen azithromycin (ZITHROMAX) 250 MG tablet 2 tabs day one, 1 tabs days two-five  . HYDROcodone-homatropine (HYCODAN) 5-1.5 MG/5ML syrup Take 5 mLs by mouth every 8 (eight) hours as needed for cough.  . Hydrocodone-Homatropine 5-1.5 MG TABS Take 1 tablet by  mouth 2 (two) times daily as needed.   No facility-administered encounter medications on file as of 12/28/2016.     Allergies: Patient has no known allergies.  Body mass index is 28.75 kg/m.  Blood pressure 126/76, pulse 70, temperature 98.5 F (36.9 C), weight 189 lb 1.6 oz (85.8 kg), SpO2 98 %.     Review of Systems  Constitutional: Positive for activity change, appetite change and fatigue. Negative for chills, diaphoresis and fever.  HENT: Positive for congestion, postnasal drip, rhinorrhea, sinus pressure and voice change. Negative for sinus pain, sneezing and sore throat.   Eyes:  Negative for visual disturbance.  Respiratory: Positive for cough and chest tightness. Negative for shortness of breath, wheezing and stridor.   Cardiovascular: Negative for chest pain, palpitations and leg swelling.  Gastrointestinal: Negative for abdominal distention, abdominal pain, blood in stool, constipation, diarrhea, nausea and vomiting.  Endocrine: Negative for cold intolerance, heat intolerance, polydipsia, polyphagia and polyuria.  Genitourinary: Negative for difficulty urinating, flank pain and hematuria.  Neurological: Positive for headaches.  Hematological: Does not bruise/bleed easily.  Psychiatric/Behavioral: Positive for sleep disturbance.       Objective:   Physical Exam  Constitutional: He is oriented to person, place, and time. He appears well-developed and well-nourished. No distress.  HENT:  Head: Normocephalic and atraumatic.  Right Ear: Hearing, external ear and ear canal normal. Tympanic membrane is bulging. Tympanic membrane is not erythematous. No decreased hearing is noted.  Left Ear: Hearing, external ear and ear canal normal. Tympanic membrane is bulging. No decreased hearing is noted.  Nose: Rhinorrhea present. Right sinus exhibits no maxillary sinus tenderness and no frontal sinus tenderness. Left sinus exhibits no maxillary sinus tenderness and no frontal sinus tenderness.  Mouth/Throat: Uvula is midline and mucous membranes are normal. Posterior oropharyngeal erythema present. No oropharyngeal exudate, posterior oropharyngeal edema or tonsillar abscesses.  Eyes: Pupils are equal, round, and reactive to light. Conjunctivae are normal.  Neck: Normal range of motion. Neck supple.  Cardiovascular: Normal rate, normal heart sounds and intact distal pulses.   No murmur heard. Pulmonary/Chest: Effort normal and breath sounds normal. No respiratory distress. He has no wheezes. He has no rales. He exhibits no tenderness.  Lymphadenopathy:    He has no cervical  adenopathy.  Neurological: He is alert and oriented to person, place, and time. Coordination normal.  Skin: Skin is warm and dry. No rash noted. He is not diaphoretic. No erythema. No pallor.  Psychiatric: He has a normal mood and affect. His behavior is normal. Judgment and thought content normal.  Nursing note and vitals reviewed.         Assessment & Plan:   1. Acute bronchitis, unspecified organism     Bronchitis, acute Increase rest/fluids/vit c. Azithromycin, Hycodan-[place] Controlled Substance Database reviewed-no contraindications noted. Alternate OTC Ibuprofen and Acetaminophen for body aches/fever. If symptoms persist after antibiotic completed, please call clinic.     FOLLOW-UP:  Return if symptoms worsen or fail to improve.

## 2016-12-28 NOTE — Addendum Note (Signed)
Addended by: Mina Marble D on: 12/28/2016 09:39 AM   Modules accepted: Orders

## 2016-12-28 NOTE — Assessment & Plan Note (Addendum)
Increase rest/fluids/vit c. Azithromycin, Hycodan-[place] Controlled Substance Database reviewed-no contraindications noted. Alternate OTC Ibuprofen and Acetaminophen for body aches/fever. If symptoms persist after antibiotic completed, please call clinic.

## 2017-01-04 ENCOUNTER — Other Ambulatory Visit (INDEPENDENT_AMBULATORY_CARE_PROVIDER_SITE_OTHER): Payer: 59

## 2017-01-04 DIAGNOSIS — E785 Hyperlipidemia, unspecified: Secondary | ICD-10-CM

## 2017-01-04 DIAGNOSIS — E7800 Pure hypercholesterolemia, unspecified: Secondary | ICD-10-CM

## 2017-01-04 DIAGNOSIS — E559 Vitamin D deficiency, unspecified: Secondary | ICD-10-CM

## 2017-01-04 MED FILL — SERTRALINE HCL 100 MG TAB: 100 | 90 days supply | Qty: 90 | Fill #1

## 2017-01-04 NOTE — Addendum Note (Signed)
Addended by: Lanier Prude D on: 01/04/2017 08:46 AM   Modules accepted: Orders

## 2017-01-05 LAB — LIPID PANEL
CHOL/HDL RATIO: 5.7 ratio — AB (ref 0.0–5.0)
Cholesterol, Total: 159 mg/dL (ref 100–199)
HDL: 28 mg/dL — AB (ref >39)
LDL Calculated: 65 mg/dL (ref 0–99)
Triglycerides: 328 mg/dL — ABNORMAL HIGH (ref 0–149)
VLDL Cholesterol Cal: 66 mg/dL — ABNORMAL HIGH (ref 5–40)

## 2017-01-05 LAB — HEPATIC FUNCTION PANEL
ALBUMIN: 4.5 g/dL (ref 3.5–5.5)
ALK PHOS: 98 IU/L (ref 39–117)
ALT: 16 IU/L (ref 0–44)
AST: 18 IU/L (ref 0–40)
BILIRUBIN, DIRECT: 0.09 mg/dL (ref 0.00–0.40)
Bilirubin Total: 0.3 mg/dL (ref 0.0–1.2)
Total Protein: 6.9 g/dL (ref 6.0–8.5)

## 2017-01-05 LAB — VITAMIN D 25 HYDROXY (VIT D DEFICIENCY, FRACTURES): Vit D, 25-Hydroxy: 37.1 ng/mL (ref 30.0–100.0)

## 2017-01-18 MED FILL — DEXILANT DR 60 MG CAPSULE: 60 | 30 days supply | Qty: 30 | Fill #6

## 2017-02-07 MED FILL — ATORVASTATIN 10 MG TABLET: 10 | 90 days supply | Qty: 90 | Fill #1

## 2017-02-22 ENCOUNTER — Other Ambulatory Visit: Payer: Self-pay | Admitting: Adult Health

## 2017-02-22 DIAGNOSIS — Z8673 Personal history of transient ischemic attack (TIA), and cerebral infarction without residual deficits: Secondary | ICD-10-CM

## 2017-02-22 MED FILL — DEXILANT DR 60 MG CAPSULE: 60 | 30 days supply | Qty: 30 | Fill #7

## 2017-02-22 MED FILL — CLOPIDOGREL 75 MG TABLET: 75 | 90 days supply | Qty: 90 | Fill #0

## 2017-03-28 MED FILL — DEXILANT DR 60 MG CAPSULE: 60 | 30 days supply | Qty: 30 | Fill #8

## 2017-04-08 ENCOUNTER — Other Ambulatory Visit: Payer: Self-pay | Admitting: Adult Health

## 2017-04-08 MED ORDER — AZITHROMYCIN 250 MG PO TABS: ORAL_TABLET | ORAL | 0 refills | Status: DC

## 2017-04-09 ENCOUNTER — Encounter: Payer: Self-pay | Admitting: Adult Health

## 2017-05-02 MED FILL — DEXILANT DR 60 MG CAPSULE: 60 | 30 days supply | Qty: 30 | Fill #9

## 2017-05-16 MED FILL — SERTRALINE HCL 100 MG TAB: 100 | 90 days supply | Qty: 90 | Fill #2

## 2017-07-09 NOTE — Progress Notes (Deleted)
Subjective:    Patient ID: Zachary Massey, male    DOB: Jun 12, 1965, 52 y.o.   MRN: [id number]  HPI:  10/11/16 OV: Zachary Massey is here to discuss ED.  He has long standing hx of hypogonadism and premature ejaculation-since late teens.  He was even in study in his early 53s for premature ejaculation.  He was treated with Testosterone injections via Urologist for years, however he feels that it did not improve the above mentioned conditions.   When intercourse is initiated it is mildly difficult to achieve erection and he reports "quick finishing".  He denies CP/palpitations/dizziness/HA/change in vision.  He has been sporadically taking Niacin 1000mg  QHS, however really has been trying to reduce saturated fat/CHO intake. 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 Wife at [facility] during encounter.  07/10/17 OV:  Zachary Massey is here for CPE He reports medication compliance, denies SE He has reports He denies tobacco/ETOH use Denies any EOTH or excessive Acetaminophen Discussed at length the importance of taking medications as directed. Continue Niacin 500mg  2 tabs QHS Start Atorvastatin Will re-check hepatic function in 4-6 weeks and lipids in 3 months. Continue heart healthy diet and continue regular movement. If lipids are improved in 3 months will then discuss ED medication.   Healthcare Maintenance:  Patient Care Team    Relationship Specialty Notifications Start End  [place], Valetta Fuller D, NP PCP - General Family Medicine  06/15/16   Garvin Fila, MD Consulting Physician Neurology  06/16/16   Ronald Lobo, MD Consulting Physician Gastroenterology  06/16/16   Kathie Rhodes, MD Consulting Physician Urology  06/16/16     Patient Active Problem List   Diagnosis Date Noted  . Bronchitis, acute 12/28/2016  . Vitamin D deficiency 06/23/2016  . Hyperlipidemia 06/23/2016  . Screening for diabetes mellitus 06/23/2016  . Fatigue 06/23/2016  .  Health care maintenance 06/23/2016  . Ganglion cyst of wrist, left 02/25/2016  . Memory loss 02/03/2015  . MCI (mild cognitive impairment) 02/03/2015  . Erectile dysfunction 07/13/2013  . Hypogonadism male 07/13/2013  . Generalized anxiety disorder 07/13/2013  . Premature ejaculation 07/13/2013  . Ichthyosis 04/09/2012  . GERD (gastroesophageal reflux disease) 12/03/2011  . History of anemia 12/01/2011  . History of TIA (transient ischemic attack) 12/01/2011     Past Medical History:  Diagnosis Date  . Anxiety   . Depression   . Erectile dysfunction   . GERD (gastroesophageal reflux disease)   . GERD (gastroesophageal reflux disease)   . Hypogonadism male   . Premature ejaculation   . Stroke ([place]) 03/13/2006   TIA (aphasia; admitted; Plavix therapy; maintained on Plavix.  Marland Kitchen TIA (transient ischemic attack)      Past Surgical History:  Procedure Laterality Date  . VASECTOMY       Family History  Problem Relation Age of Onset  . Hyperlipidemia Mother   . Cancer Paternal Grandfather        stomach, esophageal  . Heart attack Maternal Grandmother   . Stroke Paternal Grandmother   . Dementia Paternal Grandmother      Social History   Substance and Sexual Activity  Drug Use No     Social History   Substance and Sexual Activity  Alcohol Use No     Social History   Tobacco Use  Smoking Status Never Smoker  Smokeless Tobacco Never Used     Outpatient Encounter Medications as of 07/10/2017  Medication Sig  .  atorvastatin (LIPITOR) 10 MG tablet Take 1 tablet (10 mg total) by mouth daily.  Marland Kitchen azithromycin (ZITHROMAX) 250 MG tablet 2 tabs day one, 1 tabs days two-five  . Cholecalciferol (VITAMIN D3) 10000 units capsule Take 10,000 Units by mouth daily.  . clopidogrel (PLAVIX) 75 MG tablet TAKE 1 TABLET (75 MG TOTAL) BY MOUTH DAILY.  Marland Kitchen COCONUT OIL PO Take by mouth. Take 3 capsule a day  . dexlansoprazole (DEXILANT) 60 MG capsule Take 60 mg by mouth daily.  Marland Kitchen  HYDROcodone-homatropine (HYCODAN) 5-1.5 MG/5ML syrup Take 5 mLs by mouth every 8 (eight) hours as needed for cough.  . Hydrocodone-Homatropine 5-1.5 MG TABS Take 1 tablet by mouth 2 (two) times daily as needed.  . niacin 500 MG CR capsule Take 1 capsule (500 mg total) by mouth at bedtime. 2 tabs QHS  . sertraline (ZOLOFT) 100 MG tablet Take 1 tablet (100 mg total) by mouth daily.   No facility-administered encounter medications on file as of 07/10/2017.     Allergies: Patient has no known allergies.  There is no height or weight on file to calculate BMI.  There were no vitals taken for this visit.    Review of Systems  Constitutional: Positive for fatigue. Negative for activity change, appetite change, chills, diaphoresis, fever and unexpected weight change.  Eyes: Negative for visual disturbance.  Respiratory: Negative for cough, chest tightness, shortness of breath, wheezing and stridor.   Cardiovascular: Negative for chest pain, palpitations and leg swelling.  Endocrine: Negative for cold intolerance, heat intolerance, polydipsia, polyphagia and polyuria.  Genitourinary: Negative for difficulty urinating and flank pain.  Skin: Negative for color change, pallor, rash and wound.  Allergic/Immunologic: Negative for immunocompromised state.  Neurological: Negative for dizziness, tremors, weakness and headaches.  Hematological: Does not bruise/bleed easily.  Psychiatric/Behavioral: Negative for dysphoric mood and sleep disturbance. The patient is nervous/anxious.        Objective:   Physical Exam  Constitutional: He is oriented to person, place, and time. He appears well-developed and well-nourished. No distress.  Cardiovascular: Normal rate, regular rhythm, normal heart sounds and intact distal pulses.  No murmur heard. Pulmonary/Chest: Effort normal. No respiratory distress. He has no wheezes. He has no rales. He exhibits no tenderness.  Neurological: He is alert and oriented to  person, place, and time.  Skin: Skin is warm and dry. No rash noted. He is not diaphoretic. No erythema. No pallor.  Psychiatric: He has a normal mood and affect. His behavior is normal. Judgment and thought content normal.  Nursing note and vitals reviewed.         Assessment & Plan:   No diagnosis found.  No problem-specific Assessment & Plan notes found for this encounter.    FOLLOW-UP:  No follow-ups on file.

## 2017-07-10 ENCOUNTER — Encounter: Payer: 59 | Admitting: Adult Health

## 2017-07-26 ENCOUNTER — Other Ambulatory Visit: Payer: Self-pay | Admitting: Adult Health

## 2017-07-26 DIAGNOSIS — Z8673 Personal history of transient ischemic attack (TIA), and cerebral infarction without residual deficits: Secondary | ICD-10-CM

## 2017-07-26 MED FILL — ATORVASTATIN 10 MG TABLET: 10 | 90 days supply | Qty: 90 | Fill #2

## 2017-07-26 MED FILL — CLOPIDOGREL 75 MG TABLET: 75 | 90 days supply | Qty: 90 | Fill #0

## 2017-07-31 ENCOUNTER — Other Ambulatory Visit: Payer: Self-pay | Admitting: Adult Health

## 2017-07-31 DIAGNOSIS — Z0000 Encounter for general adult medical examination without abnormal findings: Secondary | ICD-10-CM

## 2017-07-31 DIAGNOSIS — R5383 Other fatigue: Secondary | ICD-10-CM

## 2017-07-31 DIAGNOSIS — Z131 Encounter for screening for diabetes mellitus: Secondary | ICD-10-CM

## 2017-07-31 DIAGNOSIS — E7800 Pure hypercholesterolemia, unspecified: Secondary | ICD-10-CM

## 2017-07-31 DIAGNOSIS — E785 Hyperlipidemia, unspecified: Secondary | ICD-10-CM

## 2017-07-31 NOTE — Progress Notes (Unsigned)
lipids

## 2017-08-01 ENCOUNTER — Other Ambulatory Visit (INDEPENDENT_AMBULATORY_CARE_PROVIDER_SITE_OTHER): Payer: 59

## 2017-08-01 DIAGNOSIS — Z0000 Encounter for general adult medical examination without abnormal findings: Secondary | ICD-10-CM | POA: Diagnosis not present

## 2017-08-01 DIAGNOSIS — E785 Hyperlipidemia, unspecified: Secondary | ICD-10-CM | POA: Diagnosis not present

## 2017-08-01 DIAGNOSIS — Z131 Encounter for screening for diabetes mellitus: Secondary | ICD-10-CM | POA: Diagnosis not present

## 2017-08-01 DIAGNOSIS — R5383 Other fatigue: Secondary | ICD-10-CM

## 2017-08-01 DIAGNOSIS — E7800 Pure hypercholesterolemia, unspecified: Secondary | ICD-10-CM | POA: Diagnosis not present

## 2017-08-02 ENCOUNTER — Other Ambulatory Visit: Payer: Self-pay | Admitting: Adult Health

## 2017-08-02 LAB — LIPID PANEL
Chol/HDL Ratio: 6.3 ratio — ABNORMAL HIGH (ref 0.0–5.0)
Cholesterol, Total: 183 mg/dL (ref 100–199)
HDL: 29 mg/dL — ABNORMAL LOW
LDL Calculated: 82 mg/dL (ref 0–99)
Triglycerides: 358 mg/dL — ABNORMAL HIGH (ref 0–149)
VLDL Cholesterol Cal: 72 mg/dL — ABNORMAL HIGH (ref 5–40)

## 2017-08-02 LAB — CBC WITH DIFFERENTIAL/PLATELET
BASOS ABS: 0 10*3/uL (ref 0.0–0.2)
BASOS: 1 %
EOS (ABSOLUTE): 0.3 10*3/uL (ref 0.0–0.4)
Eos: 4 %
Hematocrit: 41.3 % (ref 37.5–51.0)
Hemoglobin: 13.8 g/dL (ref 13.0–17.7)
IMMATURE GRANS (ABS): 0 10*3/uL (ref 0.0–0.1)
IMMATURE GRANULOCYTES: 0 %
LYMPHS: 40 %
Lymphocytes Absolute: 2.9 10*3/uL (ref 0.7–3.1)
MCH: 25.4 pg — ABNORMAL LOW (ref 26.6–33.0)
MCHC: 33.4 g/dL (ref 31.5–35.7)
MCV: 76 fL — ABNORMAL LOW (ref 79–97)
Monocytes Absolute: 0.5 10*3/uL (ref 0.1–0.9)
Monocytes: 6 %
NEUTROS PCT: 49 %
Neutrophils Absolute: 3.7 10*3/uL (ref 1.4–7.0)
PLATELETS: 263 10*3/uL (ref 150–450)
RBC: 5.44 x10E6/uL (ref 4.14–5.80)
RDW: 15.2 % (ref 12.3–15.4)
WBC: 7.4 10*3/uL (ref 3.4–10.8)

## 2017-08-02 LAB — COMPREHENSIVE METABOLIC PANEL WITH GFR
ALT: 15 [IU]/L (ref 0–44)
AST: 23 [IU]/L (ref 0–40)
Albumin/Globulin Ratio: 1.9 (ref 1.2–2.2)
Albumin: 4.6 g/dL (ref 3.5–5.5)
Alkaline Phosphatase: 90 [IU]/L (ref 39–117)
BUN/Creatinine Ratio: 13 (ref 9–20)
BUN: 13 mg/dL (ref 6–24)
Bilirubin Total: 0.5 mg/dL (ref 0.0–1.2)
CO2: 25 mmol/L (ref 20–29)
Calcium: 8.9 mg/dL (ref 8.7–10.2)
Chloride: 101 mmol/L (ref 96–106)
Creatinine, Ser: 1 mg/dL (ref 0.76–1.27)
GFR calc Af Amer: 102 mL/min/{1.73_m2}
GFR calc non Af Amer: 88 mL/min/{1.73_m2}
Globulin, Total: 2.4 g/dL (ref 1.5–4.5)
Glucose: 96 mg/dL (ref 65–99)
Potassium: 4.3 mmol/L (ref 3.5–5.2)
Sodium: 141 mmol/L (ref 134–144)
Total Protein: 7 g/dL (ref 6.0–8.5)

## 2017-08-02 LAB — HEMOGLOBIN A1C
Est. average glucose Bld gHb Est-mCnc: 111 mg/dL
Hgb A1c MFr Bld: 5.5 % (ref 4.8–5.6)

## 2017-08-02 LAB — TSH: TSH: 1.81 u[IU]/mL (ref 0.450–4.500)

## 2017-08-02 MED ORDER — OMEGA-3-ACID ETHYL ESTERS 1 G PO CAPS: 2.0000 g | ORAL_CAPSULE | Freq: Two times a day (BID) | ORAL | 2 refills | Status: DC

## 2017-08-02 MED ORDER — ATORVASTATIN CALCIUM 20 MG PO TABS: 20.0000 mg | ORAL_TABLET | Freq: Every day | ORAL | 2 refills | Status: DC

## 2017-08-02 MED FILL — OMEGA-3 ETHYL ESTERS 1 GM C: 1 | 90 days supply | Qty: 180 | Fill #0

## 2017-08-02 MED FILL — ATORVASTATIN 20 MG TABLET: 20 | 90 days supply | Qty: 90 | Fill #0

## 2017-08-02 NOTE — Progress Notes (Signed)
Increased Atorvastatin from 10mg  to 20mg  QD D/c'd Niacin and started on Lovaza 1g - 2 tabs BID

## 2017-08-15 ENCOUNTER — Encounter: Payer: Self-pay | Admitting: Adult Health

## 2017-08-15 ENCOUNTER — Ambulatory Visit (INDEPENDENT_AMBULATORY_CARE_PROVIDER_SITE_OTHER): Payer: 59 | Admitting: Adult Health

## 2017-08-15 VITALS — BP 116/71 | HR 58 | Ht 68.0 in | Wt 192.7 lb

## 2017-08-15 DIAGNOSIS — Z1211 Encounter for screening for malignant neoplasm of colon: Secondary | ICD-10-CM | POA: Diagnosis not present

## 2017-08-15 DIAGNOSIS — Z79899 Other long term (current) drug therapy: Secondary | ICD-10-CM

## 2017-08-15 DIAGNOSIS — E785 Hyperlipidemia, unspecified: Secondary | ICD-10-CM | POA: Diagnosis not present

## 2017-08-15 DIAGNOSIS — N529 Male erectile dysfunction, unspecified: Secondary | ICD-10-CM | POA: Diagnosis not present

## 2017-08-15 DIAGNOSIS — F411 Generalized anxiety disorder: Secondary | ICD-10-CM | POA: Diagnosis not present

## 2017-08-15 DIAGNOSIS — K219 Gastro-esophageal reflux disease without esophagitis: Secondary | ICD-10-CM | POA: Diagnosis not present

## 2017-08-15 DIAGNOSIS — E7800 Pure hypercholesterolemia, unspecified: Secondary | ICD-10-CM

## 2017-08-15 MED ORDER — SILDENAFIL CITRATE 50 MG PO TABS: ORAL_TABLET | ORAL | 1 refills | Status: DC

## 2017-08-15 MED FILL — SILDENAFIL CITRATE 50 MG TA: 50 | 30 days supply | Qty: 6 | Fill #0

## 2017-08-15 NOTE — Assessment & Plan Note (Signed)
The 10-year ASCVD risk score [name] DC [name]., et al., 2013) is: 4.2%   Values used to calculate the score:     Age: 52 years     Sex: Male     Is Non-Hispanic African American: No     Diabetic: No     Tobacco smoker: No     Systolic Blood Pressure: 032 mmHg     Is BP treated: No     HDL Cholesterol: 29 mg/dL     Total Cholesterol: 183 mg/dL  LDL-82  Continue atorvastatin 20mg  and Lovaza 1g 2 caps BID Heart healthy diet and continue regular movement He denies myalgia's Will re-check lipids in 6 months

## 2017-08-15 NOTE — Progress Notes (Signed)
Subjective:    Patient ID: Zachary Massey, male    DOB: May 19, 1965, 52 y.o.   MRN: [id number]  HPI: [name] is here for CPE He reports medication compliance, denies SE He has really reduced saturated fat intake and maintains high level of activity.  The 10-year ASCVD risk score [name] DC [name]., et al., 2013) is: 4.2%   Values used to calculate the score:     Age: 30 years     Sex: Male     Is Non-Hispanic African American: No     Diabetic: No     Tobacco smoker: No     Systolic Blood Pressure: 409 mmHg     Is BP treated: No     HDL Cholesterol: 29 mg/dL     Total Cholesterol: 183 mg/dL  LDL- 82 He was recently started on Lovaza 1g - 2 caps BID in late May He estimates to drink >80 o He continues to abstain from tobacco/ETOH use Reviewed all recent labs, lipids and A1c improved.  Healthcare Maintenance: Colonoscopy- due this year- referral to GI placed Immunizations- UTD   Patient Care Team    Relationship Specialty Notifications Start End  Mina Marble D, NP PCP - General Family Medicine  06/15/16   Garvin Fila, MD Consulting Physician Neurology  06/16/16   Ronald Lobo, MD Consulting Physician Gastroenterology  06/16/16   Kathie Rhodes, MD Consulting Physician Urology  06/16/16     Patient Active Problem List   Diagnosis Date Noted  . Screening for colon cancer 08/15/2017  . Vitamin D deficiency 06/23/2016  . Hyperlipidemia 06/23/2016  . Screening for diabetes mellitus 06/23/2016  . Fatigue 06/23/2016  . Health care maintenance 06/23/2016  . Ganglion cyst of wrist, left 02/25/2016  . Memory loss 02/03/2015  . Erectile dysfunction 07/13/2013  . Hypogonadism male 07/13/2013  . Generalized anxiety disorder 07/13/2013  . Premature ejaculation 07/13/2013  . Ichthyosis 04/09/2012  . GERD (gastroesophageal reflux disease) 12/03/2011  . History of anemia 12/01/2011  . History of TIA (transient ischemic attack) 12/01/2011     Past Medical History:  Diagnosis Date   . Anxiety   . Depression   . Erectile dysfunction   . GERD (gastroesophageal reflux disease)   . GERD (gastroesophageal reflux disease)   . Hypogonadism male   . Premature ejaculation   . Stroke ([place]) 03/13/2006   TIA (aphasia; admitted; Plavix therapy; maintained on Plavix.  Marland Kitchen TIA (transient ischemic attack)      Past Surgical History:  Procedure Laterality Date  . VASECTOMY       Family History  Problem Relation Age of Onset  . Hyperlipidemia Mother   . Cancer Paternal Grandfather        stomach, esophageal  . Heart attack Maternal Grandmother   . Stroke Paternal Grandmother   . Dementia Paternal Grandmother      Social History   Substance and Sexual Activity  Drug Use No     Social History   Substance and Sexual Activity  Alcohol Use No     Social History   Tobacco Use  Smoking Status Never Smoker  Smokeless Tobacco Never Used     Outpatient Encounter Medications as of 08/15/2017  Medication Sig  . atorvastatin (LIPITOR) 20 MG tablet Take 1 tablet (20 mg total) by mouth daily.  . Cholecalciferol (VITAMIN D3) 10000 units capsule Take 10,000 Units by mouth daily.  . clopidogrel (PLAVIX) 75 MG tablet TAKE 1 TABLET BY MOUTH DAILY.  Marland Kitchen omega-3  acid ethyl esters (LOVAZA) 1 g capsule Take 2 capsules (2 g total) by mouth 2 (two) times daily.  Earney Navy Bicarbonate (ZEGERID OTC PO) Take 2 tablets by mouth daily.  . sertraline (ZOLOFT) 100 MG tablet Take 1 tablet (100 mg total) by mouth daily.  . sildenafil (VIAGRA) 50 MG tablet 1/2 to 1 tab 15min to 4 hrs prior to sexual activity.  Max daily dose 100mg   . [DISCONTINUED] azithromycin (ZITHROMAX) 250 MG tablet 2 tabs day one, 1 tabs days two-five  . [DISCONTINUED] COCONUT OIL PO Take by mouth. Take 3 capsule a day  . [DISCONTINUED] dexlansoprazole (DEXILANT) 60 MG capsule Take 60 mg by mouth daily.  . [DISCONTINUED] HYDROcodone-homatropine (HYCODAN) 5-1.5 MG/5ML syrup Take 5 mLs by mouth every 8 (eight)  hours as needed for cough.  . [DISCONTINUED] Hydrocodone-Homatropine 5-1.5 MG TABS Take 1 tablet by mouth 2 (two) times daily as needed.   No facility-administered encounter medications on file as of 08/15/2017.     Allergies: Patient has no known allergies.  Body mass index is 29.3 kg/m.  Blood pressure 116/71, pulse (!) 58, height 5\' 8"  (1.727 m), weight 192 lb 11.2 oz (87.4 kg), SpO2 96 %.   Review of Systems  Constitutional: Negative for activity change, appetite change, chills, diaphoresis, fatigue, fever and unexpected weight change.  HENT: Negative for congestion.   Eyes: Negative for visual disturbance.  Respiratory: Negative for cough, chest tightness, shortness of breath, wheezing and stridor.   Cardiovascular: Negative for chest pain, palpitations and leg swelling.  Gastrointestinal: Negative for abdominal distention, abdominal pain, blood in stool, constipation, diarrhea, nausea and vomiting.  Endocrine: Negative for cold intolerance, heat intolerance, polydipsia, polyphagia and polyuria.  Genitourinary: Negative for difficulty urinating and flank pain.  Musculoskeletal: Negative for arthralgias, back pain, gait problem, joint swelling, myalgias, neck pain and neck stiffness.  Skin: Negative for color change, pallor, rash and wound.  Neurological: Negative for dizziness, tremors, weakness and headaches.  Hematological: Does not bruise/bleed easily.  Psychiatric/Behavioral: Negative for confusion, decreased concentration, dysphoric mood, hallucinations, self-injury, sleep disturbance and suicidal ideas. The patient is not nervous/anxious and is not hyperactive.       Objective:   Physical Exam  Constitutional: He is oriented to person, place, and time. He appears well-developed and well-nourished. No distress.  HENT:  Head: Normocephalic and atraumatic.  Right Ear: External ear normal. Tympanic membrane is not perforated and not bulging. No decreased hearing is noted.   Left Ear: External ear normal. Tympanic membrane is not perforated and not bulging. No decreased hearing is noted.  Nose: No mucosal edema or rhinorrhea. Right sinus exhibits no maxillary sinus tenderness and no frontal sinus tenderness. Left sinus exhibits no maxillary sinus tenderness and no frontal sinus tenderness.  Mouth/Throat: Uvula is midline, oropharynx is clear and moist and mucous membranes are normal. Normal dentition.  Eyes: Pupils are equal, round, and reactive to light. Conjunctivae and EOM are normal.  Neck: Normal range of motion. Neck supple.  Cardiovascular: Normal rate, regular rhythm, normal heart sounds and intact distal pulses.  No murmur heard. Pulmonary/Chest: Effort normal and breath sounds normal. No stridor. No respiratory distress. He has no wheezes. He has no rales. He exhibits no tenderness.  Abdominal: Soft. Bowel sounds are normal. He exhibits no distension and no mass. There is no tenderness. There is no rebound and no guarding.  Musculoskeletal: Normal range of motion. He exhibits no edema or tenderness.  Lymphadenopathy:    He has no cervical adenopathy.  Neurological: He is alert and oriented to person, place, and time. Coordination normal.  Skin: Skin is warm and dry. Capillary refill takes less than 2 seconds. No rash noted. He is not diaphoretic. No erythema. No pallor.  Psychiatric: He has a normal mood and affect. His behavior is normal. Judgment and thought content normal.  Nursing note and vitals reviewed.     Assessment & Plan:   1. Screening for colon cancer   2. Gastroesophageal reflux disease, esophagitis presence not specified   3. Erectile dysfunction, unspecified erectile dysfunction type   4. Elevated cholesterol   5. On statin therapy   6. Hyperlipidemia, unspecified hyperlipidemia type   7. Generalized anxiety disorder     GERD (gastroesophageal reflux disease) He has stopped Dexilant and is now taking OTC Zegrid and GERD sx's are  well controlled. He continues to avoid soda, spicy/acidic foods and overeating   Erectile dysfunction BP very well controlled, not on antihypertensives He has not tobacco hx Lipid panel dramatically improved, ASCVD risk 4.2% He has no personal/family hx of MI/CAD He had possible TIA 2008, TTE: neg, CT: neg Only family CVA/TIA hx was paternal grandmother, she had sig tobacco hx- smoked 2 packs/day for most of her life We discussed at length risks/benefits of sildenafil (Viarga) 50mg  and will provide rx Directions- 1/2 to 1 tab 75mins to 4 hrs prior to sexually activity Max daily dose 100mg .   Hyperlipidemia The 10-year ASCVD risk score [name] DC Jr., et al., 2013) is: 4.2%   Values used to calculate the score:     Age: 40 years     Sex: Male     Is Non-Hispanic African American: No     Diabetic: No     Tobacco smoker: No     Systolic Blood Pressure: 188 mmHg     Is BP treated: No     HDL Cholesterol: 29 mg/dL     Total Cholesterol: 183 mg/dL  LDL-82  Continue atorvastatin 20mg  and Lovaza 1g 2 caps BID Heart healthy diet and continue regular movement He denies myalgia's Will re-check lipids in 6 months  Generalized anxiety disorder Mood stable on Sertraline 100mg  QD Denies thoughts of harming himself/others     FOLLOW-UP:  Return in about 6 months (around 02/14/2018) for Fasting Labs, Regular Follow Up.

## 2017-08-15 NOTE — Assessment & Plan Note (Signed)
BP very well controlled, not on antihypertensives He has not tobacco hx Lipid panel dramatically improved, ASCVD risk 4.2% He has no personal/family hx of MI/CAD He had possible TIA 2008, TTE: neg, CT: neg Only family CVA/TIA hx was paternal grandmother, she had sig tobacco hx- smoked 2 packs/day for most of her life We discussed at length risks/benefits of sildenafil (Viarga) 50mg  and will provide rx Directions- 1/2 to 1 tab 59mins to 4 hrs prior to sexually activity Max daily dose 100mg .

## 2017-08-15 NOTE — Patient Instructions (Addendum)

## 2017-08-15 NOTE — Assessment & Plan Note (Signed)
Mood stable on Sertraline 100mg  QD Denies thoughts of harming himself/others

## 2017-08-15 NOTE — Assessment & Plan Note (Signed)
He has stopped Dexilant and is now taking OTC Zegrid and GERD sx's are well controlled. He continues to avoid soda, spicy/acidic foods and overeating

## 2017-10-01 ENCOUNTER — Other Ambulatory Visit: Payer: Self-pay | Admitting: Adult Health

## 2017-10-01 DIAGNOSIS — N529 Male erectile dysfunction, unspecified: Secondary | ICD-10-CM

## 2017-10-05 ENCOUNTER — Other Ambulatory Visit: Payer: Self-pay

## 2017-10-05 ENCOUNTER — Encounter (HOSPITAL_BASED_OUTPATIENT_CLINIC_OR_DEPARTMENT_OTHER): Payer: Self-pay | Admitting: *Deleted

## 2017-10-05 ENCOUNTER — Emergency Department (HOSPITAL_BASED_OUTPATIENT_CLINIC_OR_DEPARTMENT_OTHER)
Admission: EM | Admit: 2017-10-05 | Discharge: 2017-10-05 | Disposition: A | Discharge status: Home / Self Care | Payer: Worker's Compensation | Attending: Emergency Medicine | Admitting: Emergency Medicine

## 2017-10-05 DIAGNOSIS — Z79899 Other long term (current) drug therapy: Secondary | ICD-10-CM | POA: Diagnosis not present

## 2017-10-05 DIAGNOSIS — Z7902 Long term (current) use of antithrombotics/antiplatelets: Secondary | ICD-10-CM | POA: Diagnosis not present

## 2017-10-05 DIAGNOSIS — H16133 Photokeratitis, bilateral: Secondary | ICD-10-CM | POA: Diagnosis not present

## 2017-10-05 DIAGNOSIS — H5713 Ocular pain, bilateral: Secondary | ICD-10-CM | POA: Diagnosis present

## 2017-10-05 MED ORDER — PROPARACAINE HCL 0.5 % OP SOLN
2.0000 [drp] | Freq: Once | OPHTHALMIC | Status: AC
  Administered 2017-10-05: 2 [drp] via OPHTHALMIC

## 2017-10-05 MED ORDER — ARTIFICIAL TEARS OPHTHALMIC OINT: TOPICAL_OINTMENT | Freq: Every evening | OPHTHALMIC | 0 refills | Status: DC | PRN

## 2017-10-05 MED ORDER — FLUORESCEIN SODIUM 1 MG OP STRP
1.0000 | ORAL_STRIP | Freq: Once | OPHTHALMIC | Status: AC
  Administered 2017-10-05: 1 via OPHTHALMIC
  Filled 2017-10-05: qty 1

## 2017-10-05 MED ORDER — CYCLOPENTOLATE HCL 1 % OP SOLN: 1.0000 [drp] | Freq: Two times a day (BID) | OPHTHALMIC | 0 refills | Status: DC | PRN

## 2017-10-05 MED ORDER — PROPARACAINE HCL 0.5 % OP SOLN
OPHTHALMIC | Status: AC
  Filled 2017-10-05: qty 15

## 2017-10-05 MED ORDER — TETRACAINE HCL 0.5 % OP SOLN: 2.0000 [drp] | Freq: Once | OPHTHALMIC | Status: DC

## 2017-10-05 MED ORDER — ERYTHROMYCIN 5 MG/GM OP OINT: TOPICAL_OINTMENT | OPHTHALMIC | 0 refills | Status: DC

## 2017-10-05 MED FILL — ERYTHROMYCIN 0.5% EYE OINT: 5 | 5 days supply | Qty: 4 | Fill #0
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
## 2017-10-05 NOTE — ED Triage Notes (Signed)
States he was working on an [employer] and it arched. Pain in both his eyes.

## 2017-10-05 NOTE — Discharge Instructions (Addendum)
Get help right away if: Your pain is severe and is not helped with medicines. Your vision worsens significantly. You have white or yellow discharge on your eye. You have a white spot on your eye. Your pain or vision problems last more than 48 hours

## 2017-10-31 MED FILL — SERTRALINE HCL 100 MG TAB: 100 | 90 days supply | Qty: 90 | Fill #0

## 2017-12-31 ENCOUNTER — Ambulatory Visit (INDEPENDENT_AMBULATORY_CARE_PROVIDER_SITE_OTHER): Payer: 59

## 2017-12-31 DIAGNOSIS — Z23 Encounter for immunization: Secondary | ICD-10-CM | POA: Diagnosis not present

## 2017-12-31 NOTE — Progress Notes (Signed)
Patient came in for flu vaccine.  Patient tolerated injection well. MPulliam, CMA/RT(R)  

## 2018-01-04 ENCOUNTER — Other Ambulatory Visit: Payer: Self-pay | Admitting: Adult Health

## 2018-01-04 DIAGNOSIS — Z8673 Personal history of transient ischemic attack (TIA), and cerebral infarction without residual deficits: Secondary | ICD-10-CM

## 2018-01-04 MED FILL — CLOPIDOGREL 75 MG TABLET: 75 | 90 days supply | Qty: 90 | Fill #0

## 2018-03-12 MED FILL — ATORVASTATIN CALCIUM 20 MG: 20 | 90 days supply | Qty: 90 | Fill #1

## 2018-03-12 MED FILL — SERTRALINE HCL 100 MG TAB: 100 | 90 days supply | Qty: 90 | Fill #1

## 2018-06-10 ENCOUNTER — Other Ambulatory Visit: Payer: Self-pay | Admitting: Adult Health

## 2018-06-10 DIAGNOSIS — Z8673 Personal history of transient ischemic attack (TIA), and cerebral infarction without residual deficits: Secondary | ICD-10-CM

## 2018-06-10 MED FILL — CLOPIDOGREL 75 MG TABLET: 75 | 90 days supply | Qty: 90 | Fill #0

## 2018-07-17 ENCOUNTER — Ambulatory Visit (INDEPENDENT_AMBULATORY_CARE_PROVIDER_SITE_OTHER): Payer: 59 | Admitting: Adult Health

## 2018-07-17 ENCOUNTER — Other Ambulatory Visit: Payer: Self-pay

## 2018-07-17 ENCOUNTER — Encounter: Payer: Self-pay | Admitting: Adult Health

## 2018-07-17 VITALS — Ht 68.0 in

## 2018-07-17 DIAGNOSIS — K1379 Other lesions of oral mucosa: Secondary | ICD-10-CM

## 2018-07-17 MED ORDER — HYDROCODONE-ACETAMINOPHEN 7.5-325 MG PO TABS
1.0000 | ORAL_TABLET | Freq: Four times a day (QID) | ORAL | 0 refills | Status: AC | PRN
Start: 2018-07-17 — End: 2018-07-22

## 2018-07-17 MED ORDER — HYDROCODONE-ACETAMINOPHEN 7.5-325 MG PO TABS: 1.0000 | ORAL_TABLET | Freq: Four times a day (QID) | ORAL | 0 refills | Status: DC | PRN

## 2018-07-17 MED FILL — HYDROCODON-APAP 7.5-325: 7.5-325 | 5 days supply | Qty: 20 | Fill #0

## 2018-07-17 MED FILL — AMOXICILLIN 500 MG CAPSULE: 500 | 10 days supply | Qty: 32 | Fill #0

## 2018-07-17 NOTE — Assessment & Plan Note (Signed)
Assessment and Plan: OTC Ibuprofen for mild-moderate pain Hydrocodone for severe pain- [facility] Controlled Substance Database reviewed- no aberrancies noted Warm salt water gargles several times/day Eat soft foods Drink fluids at room temp F/u with oral surgeon as directed  COVID-19 Education: Signs and symptoms of COVID-19 infection were discussed with pt and how to seek care for testing.  The importance of following the Stay at Home order, and when out- Social Distancing and wearing a facial mask were discussed today. Follow Up Instructions: PRN   I discussed the assessment and treatment plan with the patient. The patient was provided an opportunity to ask questions and all were answered. The patient agreed with the plan and demonstrated an understanding of the instructions.   The patient was advised to call back or seek an in-person evaluation if the symptoms worsen or if the condition fails to improve as anticipated.

## 2018-07-17 NOTE — Progress Notes (Signed)
Virtual Visit via Telephone Note  I connected with Zachary Massey on 07/17/18 at  2:00 PM EDT by telephone and verified that I am speaking with the correct person using two identifiers.  Location: Patient: Home Provider: In Clinic   I discussed the limitations, risks, security and privacy concerns of performing an evaluation and management service by telephone and the availability of in person appointments. I also discussed with the patient that there may be a patient responsible charge related to this service. The patient expressed understanding and agreed to proceed.   History of Present Illness: Zachary Massey calls in today with complaint severe oral pain r/t two cracked teeth that broke >3 weeks ago after he "bit down on something hard". He reports untreated cavities for "quite awhile", has been unable to make regular dental appt's due to hectic work schedule. He has dental xrays completed at his dentist today, scheduled for oral surgery next Tuesday Mat 12, 2020. His oral surgeon sent in course of Amoxicillin, did not provide any pain control. He reports constant pain, rated 8/10, and described as "metal across your teeth sensation". He reports eating, drinking, and even just "air hurts". He has been using OTC Ibuprofen every 6 hrs with only minimal sx relief. He denies N/V/D He denies CP/dyspnea/cough/fever/night sweats  Review of Systems: General:   Denies fever, chills, unexplained weight loss, PAIN+  Optho/Auditory:   Denies visual changes, blurred vision/LOV Respiratory:   Denies SOB, DOE more than baseline levels.  Cardiovascular:   Denies chest pain, palpitations, new onset peripheral edema  Gastrointestinal:   Denies nausea, vomiting, diarrhea.  Genitourinary: Denies dysuria, freq/ urgency, flank pain or discharge from genitals.  Endocrine:     Denies hot or cold intolerance, polyuria, polydipsia. Musculoskeletal:   Denies unexplained myalgias, joint swelling, unexplained  arthralgias, gait problems.  Skin:  Denies rash, suspicious lesions Neurological:     Denies dizziness, unexplained weakness, numbness  Psychiatric/Behavioral:   Denies mood changes, suicidal or homicidal ideations, hallucinations COVID-19 Education: Signs and symptoms of COVID-19 infection were discussed with pt and how to seek care for testing.  The importance of following the Stay at Home order, and when out- Social Distancing and wearing a facial mask were discussed today.    Patient Care Team    Relationship Specialty Notifications Start End  Mina Marble D, NP PCP - General Family Medicine  06/15/16   Garvin Fila, MD Consulting Physician Neurology  06/16/16   Ronald Lobo, MD Consulting Physician Gastroenterology  06/16/16   Kathie Rhodes, MD Consulting Physician Urology  06/16/16     Patient Active Problem List   Diagnosis Date Noted  . Screening for colon cancer 08/15/2017  . Vitamin D deficiency 06/23/2016  . Hyperlipidemia 06/23/2016  . Screening for diabetes mellitus 06/23/2016  . Fatigue 06/23/2016  . Health care maintenance 06/23/2016  . Ganglion cyst of wrist, left 02/25/2016  . Memory loss 02/03/2015  . Erectile dysfunction 07/13/2013  . Hypogonadism male 07/13/2013  . Generalized anxiety disorder 07/13/2013  . Premature ejaculation 07/13/2013  . Ichthyosis 04/09/2012  . GERD (gastroesophageal reflux disease) 12/03/2011  . History of anemia 12/01/2011  . History of TIA (transient ischemic attack) 12/01/2011     Past Medical History:  Diagnosis Date  . Anxiety   . Depression   . Erectile dysfunction   . GERD (gastroesophageal reflux disease)   . GERD (gastroesophageal reflux disease)   . Hypogonadism male   . Premature ejaculation   . Stroke [facility]) 03/13/2006  TIA (aphasia; admitted; Plavix therapy; maintained on Plavix.  Marland Kitchen TIA (transient ischemic attack)      Past Surgical History:  Procedure Laterality Date  . VASECTOMY       Family History   Problem Relation Age of Onset  . Hyperlipidemia Mother   . Cancer Paternal Grandfather        stomach, esophageal  . Heart attack Maternal Grandmother   . Stroke Paternal Grandmother   . Dementia Paternal Grandmother      Social History   Substance and Sexual Activity  Drug Use No     Social History   Substance and Sexual Activity  Alcohol Use No     Social History   Tobacco Use  Smoking Status Never Smoker  Smokeless Tobacco Never Used     Outpatient Encounter Medications as of 07/17/2018  Medication Sig  . artificial tears (LACRILUBE) OINT ophthalmic ointment Place into both eyes at bedtime as needed for dry eyes.  Marland Kitchen atorvastatin (LIPITOR) 20 MG tablet Take 1 tablet (20 mg total) by mouth daily.  . Cholecalciferol (VITAMIN D3) 10000 units capsule Take 10,000 Units by mouth daily.  . clopidogrel (PLAVIX) 75 MG tablet TAKE 1 TABLET BY MOUTH ONCE DAILY  . cyclopentolate (CYCLODRYL,CYCLOGYL) 1 % ophthalmic solution Place 1 drop into both eyes every 12 (twelve) hours as needed.  Marland Kitchen erythromycin ophthalmic ointment Place a 1/2 inch ribbon of ointment into the lower eyelid. Every 6 hours  . omega-3 acid ethyl esters (LOVAZA) 1 g capsule Take 2 capsules (2 g total) by mouth 2 (two) times daily.  Earney Navy Bicarbonate (ZEGERID OTC PO) Take 2 tablets by mouth daily.  . sertraline (ZOLOFT) 100 MG tablet TAKE 1 TABLET (100 MG TOTAL) BY MOUTH DAILY.  . sildenafil (VIAGRA) 50 MG tablet 1/2 to 1 tab 26min to 4 hrs prior to sexual activity.  Max daily dose 100mg    No facility-administered encounter medications on file as of 07/17/2018.     Allergies: Patient has no known allergies.  There is no height or weight on file to calculate BMI.  There were no vitals taken for this visit.    Observations/Objective: Pt's speech sounds quite muffled over the telephone  Assessment and Plan: OTC Ibuprofen for mild-moderate pain Hydrocodone for severe pain- [facility]  Controlled Substance Database reviewed- no aberrancies noted Warm salt water gargles several times/day Eat soft foods Drink fluids at room temp F/u with oral surgeon as directed  COVID-19 Education: Signs and symptoms of COVID-19 infection were discussed with pt and how to seek care for testing.  The importance of following the Stay at Home order, and when out- Social Distancing and wearing a facial mask were discussed today. Follow Up Instructions: PRN   I discussed the assessment and treatment plan with the patient. The patient was provided an opportunity to ask questions and all were answered. The patient agreed with the plan and demonstrated an understanding of the instructions.   The patient was advised to call back or seek an in-person evaluation if the symptoms worsen or if the condition fails to improve as anticipated.  I provided 12 minutes of non-face-to-face time during this encounter.   Esaw Grandchild, NP

## 2018-07-23 ENCOUNTER — Other Ambulatory Visit (INDEPENDENT_AMBULATORY_CARE_PROVIDER_SITE_OTHER): Payer: 59

## 2018-07-23 ENCOUNTER — Other Ambulatory Visit: Payer: Self-pay

## 2018-07-23 DIAGNOSIS — Z0000 Encounter for general adult medical examination without abnormal findings: Secondary | ICD-10-CM | POA: Diagnosis not present

## 2018-07-23 DIAGNOSIS — Z131 Encounter for screening for diabetes mellitus: Secondary | ICD-10-CM | POA: Diagnosis not present

## 2018-07-23 DIAGNOSIS — Z79899 Other long term (current) drug therapy: Secondary | ICD-10-CM | POA: Diagnosis not present

## 2018-07-23 DIAGNOSIS — Z862 Personal history of diseases of the blood and blood-forming organs and certain disorders involving the immune mechanism: Secondary | ICD-10-CM | POA: Diagnosis not present

## 2018-07-23 DIAGNOSIS — R5383 Other fatigue: Secondary | ICD-10-CM | POA: Diagnosis not present

## 2018-07-23 DIAGNOSIS — E7800 Pure hypercholesterolemia, unspecified: Secondary | ICD-10-CM

## 2018-07-24 ENCOUNTER — Other Ambulatory Visit: Payer: Self-pay | Admitting: Adult Health

## 2018-07-24 LAB — SPECIMEN STATUS

## 2018-07-25 LAB — COMPREHENSIVE METABOLIC PANEL
ALT: 18 IU/L (ref 0–44)
AST: 24 IU/L (ref 0–40)
Albumin/Globulin Ratio: 2 (ref 1.2–2.2)
Albumin: 4.5 g/dL (ref 4.0–5.0)
Alkaline Phosphatase: 99 IU/L (ref 39–117)
BUN/Creatinine Ratio: 14 (ref 9–20)
BUN: 12 mg/dL (ref 6–24)
Bilirubin Total: 0.4 mg/dL (ref 0.0–1.2)
CO2: 20 mmol/L (ref 20–29)
Calcium: 8.9 mg/dL (ref 8.7–10.2)
Chloride: 107 mmol/L — ABNORMAL HIGH (ref 96–106)
Creatinine, Ser: 0.84 mg/dL (ref 0.76–1.27)
GFR calc Af Amer: 118 mL/min/{1.73_m2} (ref >59)
GFR calc non Af Amer: 102 mL/min/{1.73_m2} (ref >59)
Globulin, Total: 2.3 g/dL (ref 1.5–4.5)
Glucose: 102 mg/dL — ABNORMAL HIGH (ref 65–99)
Potassium: 4.3 mmol/L (ref 3.5–5.2)
Sodium: 145 mmol/L — ABNORMAL HIGH (ref 134–144)
Total Protein: 6.8 g/dL (ref 6.0–8.5)

## 2018-07-25 LAB — LIPID PANEL
Chol/HDL Ratio: 5.3 ratio — ABNORMAL HIGH (ref 0.0–5.0)
Cholesterol, Total: 144 mg/dL (ref 100–199)
HDL: 27 mg/dL — ABNORMAL LOW (ref >39)
LDL Calculated: 59 mg/dL (ref 0–99)
Triglycerides: 288 mg/dL — ABNORMAL HIGH (ref 0–149)
VLDL Cholesterol Cal: 58 mg/dL — ABNORMAL HIGH (ref 5–40)

## 2018-07-25 LAB — CBC WITH DIFFERENTIAL/PLATELET
Basophils Absolute: 0.1 10*3/uL (ref 0.0–0.2)
Basos: 1 %
EOS (ABSOLUTE): 0.4 10*3/uL (ref 0.0–0.4)
Eos: 6 %
Hematocrit: 40.4 % (ref 37.5–51.0)
Hemoglobin: 13.7 g/dL (ref 13.0–17.7)
Immature Grans (Abs): 0 10*3/uL (ref 0.0–0.1)
Immature Granulocytes: 0 %
Lymphocytes Absolute: 2.6 10*3/uL (ref 0.7–3.1)
Lymphs: 35 %
MCH: 25.4 pg — ABNORMAL LOW (ref 26.6–33.0)
MCHC: 33.9 g/dL (ref 31.5–35.7)
MCV: 75 fL — ABNORMAL LOW (ref 79–97)
Monocytes Absolute: 0.6 10*3/uL (ref 0.1–0.9)
Monocytes: 8 %
Neutrophils Absolute: 3.7 10*3/uL (ref 1.4–7.0)
Neutrophils: 50 %
Platelets: 273 10*3/uL (ref 150–450)
RBC: 5.4 x10E6/uL (ref 4.14–5.80)
RDW: 14.9 % (ref 11.6–15.4)
WBC: 7.4 10*3/uL (ref 3.4–10.8)

## 2018-07-25 LAB — SPECIMEN STATUS REPORT

## 2018-07-25 LAB — ALT: ALT: 20 IU/L (ref 0–44)

## 2018-07-25 LAB — TSH: TSH: 2.55 u[IU]/mL (ref 0.450–4.500)

## 2018-07-25 LAB — HGB A1C W/O EAG: Hgb A1c MFr Bld: 5.7 % — ABNORMAL HIGH (ref 4.8–5.6)

## 2018-08-16 MED FILL — SERTRALINE HCL 100 MG TAB: 100 | 90 days supply | Qty: 90 | Fill #2

## 2018-09-03 ENCOUNTER — Other Ambulatory Visit: Payer: Self-pay | Admitting: Adult Health

## 2018-09-03 DIAGNOSIS — D229 Melanocytic nevi, unspecified: Secondary | ICD-10-CM

## 2018-09-04 ENCOUNTER — Ambulatory Visit (INDEPENDENT_AMBULATORY_CARE_PROVIDER_SITE_OTHER): Payer: Self-pay | Admitting: Adult Health

## 2018-09-04 ENCOUNTER — Encounter: Payer: Self-pay | Admitting: Adult Health

## 2018-09-04 ENCOUNTER — Other Ambulatory Visit: Payer: Self-pay

## 2018-09-04 DIAGNOSIS — Z0289 Encounter for other administrative examinations: Secondary | ICD-10-CM

## 2018-09-04 LAB — POCT URINALYSIS DIPSTICK
Bilirubin, UA: NEGATIVE
Blood, UA: NEGATIVE
Glucose, UA: NEGATIVE
Ketones, UA: NEGATIVE
Leukocytes, UA: NEGATIVE
Nitrite, UA: NEGATIVE
Protein, UA: NEGATIVE
Spec Grav, UA: 1.025 (ref 1.010–1.025)
Urobilinogen, UA: 0.2 E.U./dL
pH, UA: 6 (ref 5.0–8.0)

## 2018-09-04 NOTE — Progress Notes (Signed)
Subjective:    Patient ID: Zachary Massey, male    DOB: Jun 20, 1965, 53 y.o.   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 He denies CP/dyspnea/dizziness/HA/palpitations.  Needs CPE with fasting labs  Patient Care Team    Relationship Specialty Notifications Start End  Mina Marble D, NP PCP - General Family Medicine  06/15/16   Garvin Fila, MD Consulting Physician Neurology  06/16/16   Ronald Lobo, MD Consulting Physician Gastroenterology  06/16/16   Kathie Rhodes, MD Consulting Physician Urology  06/16/16     Patient Active Problem List   Diagnosis Date Noted  . 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 Oral pain 07/17/2018  . Screening for colon cancer 08/15/2017  . Vitamin D deficiency 06/23/2016  . Hyperlipidemia 06/23/2016  . Screening for diabetes mellitus 06/23/2016  . Fatigue 06/23/2016  . Health care maintenance 06/23/2016  . Ganglion cyst of wrist, left 02/25/2016  . Memory loss 02/03/2015  . Erectile dysfunction 07/13/2013  . Hypogonadism male 07/13/2013  . Generalized anxiety disorder 07/13/2013  . Premature ejaculation 07/13/2013  . Ichthyosis 04/09/2012  . GERD (gastroesophageal reflux disease) 12/03/2011  . History of anemia 12/01/2011  . History of TIA (transient ischemic attack) 12/01/2011     Past Medical History:  Diagnosis Date  . Anxiety   . Depression   . Erectile dysfunction   . GERD (gastroesophageal reflux disease)   . GERD (gastroesophageal reflux disease)   . Hypogonadism male   . Premature ejaculation   . Stroke ([place]) 03/13/2006   TIA (aphasia; admitted; Plavix therapy; maintained on Plavix.  Marland Kitchen TIA (transient ischemic attack)      Past Surgical History:  Procedure Laterality Date  . VASECTOMY        Family History  Problem Relation Age of Onset  . Hyperlipidemia Mother   . Cancer Paternal Grandfather        stomach, esophageal  . Heart attack Maternal Grandmother   . Stroke Paternal Grandmother   . Dementia Paternal Grandmother      Social History   Substance and Sexual Activity  Drug Use No     Social History   Substance and Sexual Activity  Alcohol Use No     Social History   Tobacco Use  Smoking Status Never Smoker  Smokeless Tobacco Never Used     Outpatient Encounter Medications as of 09/04/2018  Medication Sig  . atorvastatin (LIPITOR) 20 MG tablet Take 1 tablet (20 mg total) by mouth daily.  . clopidogrel (PLAVIX) 75 MG tablet TAKE 1 TABLET BY MOUTH ONCE DAILY  . Omeprazole-Sodium Bicarbonate (ZEGERID OTC PO) Take 2 tablets by mouth daily.  . sertraline (ZOLOFT) 100 MG tablet TAKE 1 TABLET (100 MG TOTAL) BY MOUTH DAILY.   No facility-administered encounter medications on file as of 09/04/2018.     Allergies: Patient has no known allergies.  Body mass index is 31.12 kg/m.  Blood pressure 127/75, pulse 66, temperature 98.2 F (36.8 C), height 5\' 7"  (1.702 m), weight 198 lb 11.2 oz (90.1 kg), SpO2 99 %.  Review of Systems  Constitutional: Positive for fatigue. Negative for activity change, appetite change, chills, diaphoresis, fever and unexpected weight change.  HENT: Negative for congestion.   Eyes: Negative for visual disturbance.  Respiratory: Negative for cough, chest  tightness, shortness of breath, wheezing and stridor.   Cardiovascular: Negative for chest pain, palpitations and leg swelling.  Gastrointestinal: Negative for abdominal distention, abdominal pain, blood in stool, constipation, diarrhea, nausea and vomiting.  Endocrine: Negative for cold intolerance, heat intolerance, polydipsia, polyphagia and polyuria.  Genitourinary: Negative for difficulty urinating, flank pain and hematuria.  Musculoskeletal: Negative for arthralgias,  back pain, gait problem, joint swelling, myalgias, neck pain and neck stiffness.  Skin: Negative for color change, pallor, rash and wound.  Neurological: Negative for dizziness, tremors, weakness and headaches.  Hematological: Negative for adenopathy. Does not bruise/bleed easily.  Psychiatric/Behavioral: Negative for agitation, behavioral problems, confusion, decreased concentration, dysphoric mood, hallucinations, self-injury, sleep disturbance and suicidal ideas. The patient is not nervous/anxious and is not hyperactive.        Objective:   Physical Exam Constitutional:      General: He is not in acute distress.    Appearance: Normal appearance. He is normal weight. He is not ill-appearing, toxic-appearing or diaphoretic.  HENT:     Head: Normocephalic and atraumatic.     Right Ear: Tympanic membrane, ear canal and external ear normal. There is no impacted cerumen.     Left Ear: Ear canal and external ear normal. There is no impacted cerumen.     Nose: Nose normal. No congestion.     Mouth/Throat:     Mouth: Mucous membranes are moist.     Pharynx: No oropharyngeal exudate.  Eyes:     Extraocular Movements: Extraocular movements intact.     Conjunctiva/sclera: Conjunctivae normal.     Pupils: Pupils are equal, round, and reactive to light.  Neck:     Musculoskeletal: Normal range of motion. No muscular tenderness.  Cardiovascular:     Rate and Rhythm: Normal rate.     Pulses: Normal pulses.     Heart sounds: Normal heart sounds. No murmur. No friction rub. No gallop.   Pulmonary:     Effort: Pulmonary effort is normal. No respiratory distress.     Breath sounds: Normal breath sounds. No stridor. No wheezing, rhonchi or rales.  Chest:     Chest wall: No tenderness.  Abdominal:     General: Abdomen is flat. Bowel sounds are normal. There is no distension.     Palpations: There is no mass.     Tenderness: There is no abdominal tenderness. There is no right CVA tenderness, left  CVA tenderness, guarding or rebound.     Hernia: No hernia is present.  Musculoskeletal: Normal range of motion.        General: No tenderness.  Lymphadenopathy:     Cervical: No cervical adenopathy.  Skin:    General: Skin is warm and dry.     Capillary Refill: Capillary refill takes less than 2 seconds.  Neurological:     Mental Status: He is alert and oriented to person, place, and time.     Coordination: Coordination normal.  Psychiatric:        Mood and Affect: Mood normal.        Behavior: Behavior normal.        Thought Content: Thought content normal.        Judgment: Judgment normal.       Assessment & Plan:   1. Encounter for examination required by Department of Transportation (DOT)     Encounter for examination required by Department of Transportation (DOT) Continue all medications as directed. Increase water intake, strive for at least 100 ounces/day.   Follow  Mediterranean diet Increase regular exercise.  Recommend at least 30 minutes daily, 5 days per week of walking, jogging, biking, swimming, YouTube/Pinterest workout videos. 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 Please schedule complete physical in 3 months, fasting labs the week prior. Continue to social distance and when out in public wear a mask.    FOLLOW-UP:  Return in about 3 months (around 12/05/2018) for CPE, Fasting Labs.

## 2018-09-04 NOTE — Patient Instructions (Signed)
Mediterranean Diet A Mediterranean diet refers to food and lifestyle choices that are based on the traditions of countries located on the [employer]. This way of eating has been shown to help prevent certain conditions and improve outcomes for people who have chronic diseases, like kidney disease and heart disease. What are tips for following this plan? Lifestyle  Cook and eat meals together with your family, when possible.  Drink enough fluid to keep your urine clear or pale yellow.  Be physically active every day. This includes: ? Aerobic exercise like running or swimming. ? Leisure activities like gardening, walking, or housework.  Get 7-8 hours of sleep each night.  If recommended by your health care provider, drink red wine in moderation. This means 1 glass a day for nonpregnant women and 2 glasses a day for men. A glass of wine equals 5 oz (150 mL). Reading food labels   Check the serving size of packaged foods. For foods such as rice and pasta, the serving size refers to the amount of cooked product, not dry.  Check the total fat in packaged foods. Avoid foods that have saturated fat or trans fats.  Check the ingredients list for added sugars, such as corn syrup. Shopping  At the grocery store, buy most of your food from the areas near the walls of the store. This includes: ? Fresh fruits and vegetables (produce). ? Grains, beans, nuts, and seeds. Some of these may be available in unpackaged forms or large amounts (in bulk). ? Fresh seafood. ? Poultry and eggs. ? Low-fat dairy products.  Buy whole ingredients instead of prepackaged foods.  Buy fresh fruits and vegetables in-season from local farmers markets.  Buy frozen fruits and vegetables in resealable bags.  If you do not have access to quality fresh seafood, buy precooked frozen shrimp or canned fish, such as tuna, salmon, or sardines.  Buy small amounts of raw or cooked vegetables, salads, or olives from  the deli or salad bar at your store.  Stock your pantry so you always have certain foods on hand, such as olive oil, canned tuna, canned tomatoes, rice, pasta, and beans. Cooking  Cook foods with extra-virgin olive oil instead of using butter or other vegetable oils.  Have meat as a side dish, and have vegetables or grains as your main dish. This means having meat in small portions or adding small amounts of meat to foods like pasta or stew.  Use beans or vegetables instead of meat in common dishes like chili or lasagna.  Experiment with different cooking methods. Try roasting or broiling vegetables instead of steaming or sauteing them.  Add frozen vegetables to soups, stews, pasta, or rice.  Add nuts or seeds for added healthy fat at each meal. You can add these to yogurt, salads, or vegetable dishes.  Marinate fish or vegetables using olive oil, lemon juice, garlic, and fresh herbs. Meal planning   Plan to eat 1 vegetarian meal one day each week. Try to work up to 2 vegetarian meals, if possible.  Eat seafood 2 or more times a week.  Have healthy snacks readily available, such as: ? Vegetable sticks with hummus. ? [place] yogurt. ? Fruit and nut trail mix.  Eat balanced meals throughout the week. This includes: ? Fruit: 2-3 servings a day ? Vegetables: 4-5 servings a day ? Low-fat dairy: 2 servings a day ? Fish, poultry, or lean meat: 1 serving a day ? Beans and legumes: 2 or more servings a week ?  Nuts and seeds: 1-2 servings a day ? Whole grains: 6-8 servings a day ? Extra-virgin olive oil: 3-4 servings a day  Limit red meat and sweets to only a few servings a month What are my food choices?  Mediterranean diet ? Recommended ? Grains: Whole-grain pasta. Brown rice. Bulgar wheat. Polenta. Couscous. Whole-wheat bread. [place]. ? Vegetables: Artichokes. Beets. Broccoli. Cabbage. Carrots. Eggplant. Green beans. Chard. Kale. Spinach. Onions. Leeks. Peas. Squash.  Tomatoes. Peppers. Radishes. ? Fruits: Apples. Apricots. Avocado. Berries. Bananas. Cherries. Dates. Figs. Grapes. Lemons. Melon. Oranges. Peaches. Plums. Pomegranate. ? Meats and other protein foods: Beans. Almonds. Sunflower seeds. Pine nuts. Peanuts. [place]. Salmon. [place]all[place]. Shrimp. [place]. Tilapia. Clams. Oysters. Eggs. ? Dairy: Low-fat milk. Cheese. Greek yogurt. ? Beverages: Water. Red wine. Herbal tea. ? Fats and oils: Extra virgin olive oil. Avocado oil. Grape seed oil. ? Sweets and desserts: [place] yogurt with honey. Baked apples. Poached pears. Trail mix. ? Seasoning and other foods: Basil. Cilantro. Coriander. Cumin. Mint. Parsley. Sage. Rosemary. Tarragon. Garlic. Oregano. Thyme. Pepper. Balsalmic vinegar. Tahini. Hummus. Tomato sauce. Olives. Mushrooms. ? Limit these ? Grains: Prepackaged pasta or rice dishes. Prepackaged cereal with added sugar. ? Vegetables: Deep fried potatoes (french fries). ? Fruits: Fruit canned in syrup. ? Meats and other protein foods: Beef. Pork. Lamb. Poultry with skin. Hot dogs. [place] Salines. ? Dairy: Ice cream. Sour cream. Whole milk. ? Beverages: Juice. Sugar-sweetened soft drinks. Beer. Liquor and spirits. ? Fats and oils: Butter. Canola oil. Vegetable oil. Beef fat (tallow). Lard. ? Sweets and desserts: Cookies. Cakes. Pies. Candy. ? Seasoning and other foods: Mayonnaise. Premade sauces and marinades. ? The items listed may not be a complete list. Talk with your dietitian about what dietary choices are right for you. Summary  The Mediterranean diet includes both food and lifestyle choices.  Eat a variety of fresh fruits and vegetables, beans, nuts, seeds, and whole grains.  Limit the amount of red meat and sweets that you eat.  Talk with your health care provider about whether it is safe for you to drink red wine in moderation. This means 1 glass a day for nonpregnant women and 2 glasses a day for men. A glass of wine equals 5 oz (150 mL). This information  is not intended to replace advice given to you by your health care provider. Make sure you discuss any questions you have with your health care provider. Document Released: 10/21/2015 Document Revised: 11/23/2015 Document Reviewed: 10/21/2015 Elsevier Interactive Patient Education  2019 [place] all medications as directed. Increase water intake, strive for at least 100 ounces/day.   Follow Mediterranean diet Increase regular exercise.  Recommend at least 30 minutes daily, 5 days per week of walking, jogging, biking, swimming, YouTube/Pinterest workout videos. 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 Please schedule complete physical in 3 months, fasting labs the week prior. Continue to social distance and when out in public wear a mask. GREAT TO SEE YOU!

## 2018-09-04 NOTE — Assessment & Plan Note (Signed)
Continue all medications as directed. Increase water intake, strive for at least 100 ounces/day.   Follow Mediterranean diet Increase regular exercise.  Recommend at least 30 minutes daily, 5 days per week of walking, jogging, biking, swimming, YouTube/Pinterest workout videos. 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 Please schedule complete physical in 3 months, fasting labs the week prior. Continue to social distance and when out in public wear a mask.

## 2018-09-09 ENCOUNTER — Other Ambulatory Visit: Payer: Self-pay | Admitting: Adult Health

## 2018-09-09 MED FILL — ATORVASTATIN 20 MG TABLET: 20 | 90 days supply | Qty: 90 | Fill #0

## 2018-09-09 MED FILL — SERTRALINE HCL 100 MG TABS: 100 | 90 days supply | Qty: 90 | Fill #2

## 2018-09-30 ENCOUNTER — Ambulatory Visit (INDEPENDENT_AMBULATORY_CARE_PROVIDER_SITE_OTHER): Payer: 59 | Admitting: Physician Assistant

## 2018-09-30 ENCOUNTER — Encounter: Payer: Self-pay | Admitting: Physician Assistant

## 2018-09-30 ENCOUNTER — Other Ambulatory Visit: Payer: Self-pay

## 2018-09-30 VITALS — BP 123/80 | HR 75 | Temp 98.0°F | Wt 190.3 lb

## 2018-09-30 DIAGNOSIS — E781 Pure hyperglyceridemia: Secondary | ICD-10-CM | POA: Insufficient documentation

## 2018-09-30 DIAGNOSIS — E782 Mixed hyperlipidemia: Secondary | ICD-10-CM

## 2018-09-30 DIAGNOSIS — Z8673 Personal history of transient ischemic attack (TIA), and cerebral infarction without residual deficits: Secondary | ICD-10-CM

## 2018-09-30 DIAGNOSIS — F411 Generalized anxiety disorder: Secondary | ICD-10-CM | POA: Diagnosis not present

## 2018-09-30 MED ORDER — ICOSAPENT ETHYL 1 G PO CAPS: ORAL_CAPSULE | ORAL | 4 refills | Status: DC

## 2018-09-30 MED FILL — VASCEPA 1 GM CAPSULE: 1 | 90 days supply | Qty: 360 | Fill #0

## 2018-09-30 NOTE — Progress Notes (Signed)
New Patient Office Visit  Subjective:  Patient ID: Zachary Massey, male    DOB: 22-Sep-1965  Age: 53 y.o. MRN: [id number]  CC:  Chief Complaint  Patient presents with  . Establish Care    HPI Zachary Massey presents to establish care.   He is doing well. No problems and/or concerns. He does have hx of stroke 2008. He used to see neurologist but now managed by PCP. He does not having any after effects of stroke. He has been on plavix since.   HIs mood is controlled on zoloft.   Past Medical History:  Diagnosis Date  . Anxiety   . Depression   . Erectile dysfunction   . GERD (gastroesophageal reflux disease)   . GERD (gastroesophageal reflux disease)   . Hypogonadism male   . Premature ejaculation   . Stroke ([place]) 03/13/2006   TIA (aphasia; admitted; Plavix therapy; maintained on Plavix.  Marland Kitchen TIA (transient ischemic attack)     Past Surgical History:  Procedure Laterality Date  . VASECTOMY      Family History  Problem Relation Age of Onset  . Hyperlipidemia Mother   . Cancer Paternal Grandfather        stomach, esophageal  . Heart attack Maternal Grandmother   . Stroke Paternal Grandmother   . Dementia Paternal Grandmother     Social History   Socioeconomic History  . Marital status: Married    Spouse name: Not on file  . Number of children: Not on file  . Years of education: Not on file  . Highest education level: Not on file  Occupational History  . Occupation: Copy: [employer]    Comment: [place]  . Financial resource strain: Not on file  . Food insecurity    Worry: Not on file    Inability: Not on file  . Transportation needs    Medical: Not on file    Non-medical: Not on file  Tobacco Use  . Smoking status: Never Smoker  . Smokeless tobacco: Never Used  Substance and Sexual Activity  . Alcohol use: No  . Drug use: No  . Sexual activity: Yes    Birth control/protection: Surgical  Lifestyle  . Physical  activity    Days per week: Not on file    Minutes per session: Not on file  . Stress: Not on file  Relationships  . Social Herbalist on phone: Not on file    Gets together: Not on file    Attends religious service: Not on file    Active member of club or organization: Not on file    Attends meetings of clubs or organizations: Not on file    Relationship status: Not on file  . Intimate partner violence    Fear of current or ex partner: Not on file    Emotionally abused: Not on file    Physically abused: Not on file    Forced sexual activity: Not on file  Other Topics Concern  . Not on file  Social History Narrative   Marital status: married x 18 years; happily married.      Children: twin 73 yo.      Lives: with wife, twins.      Exercise: none      Seatbelt:  100%      Guns:  Loaded and secured.  Gun safe.    ROS Review of Systems  All other systems reviewed  and are negative.   Objective:   Today's Vitals: BP 123/80   Pulse 75   Temp 98 F (36.7 C) (Oral)   Wt 190 lb 4.8 oz (86.3 kg)   SpO2 97%   BMI 29.81 kg/m   Physical Exam Vitals signs reviewed.  Constitutional:      Appearance: Normal appearance.  Cardiovascular:     Rate and Rhythm: Normal rate.  Pulmonary:     Effort: Pulmonary effort is normal.  Neurological:     Mental Status: He is alert and oriented to person, place, and time.  Psychiatric:        Mood and Affect: Mood normal.     Assessment & Plan:   Problem List Items Addressed This Visit      Unprioritized   Hypertriglyceridemia - Primary   Relevant Medications   Icosapent Ethyl 1 g CAPS      Outpatient Encounter Medications as of 09/30/2018  Medication Sig  . atorvastatin (LIPITOR) 20 MG tablet TAKE 1 TABLET BY MOUTH DAILY.  Marland Kitchen clopidogrel (PLAVIX) 75 MG tablet TAKE 1 TABLET BY MOUTH ONCE DAILY  . Icosapent Ethyl 1 g CAPS 2 tablets by mouth twice a day.  Earney Navy Bicarbonate (ZEGERID OTC PO) Take 2 tablets  by mouth daily.  . sertraline (ZOLOFT) 100 MG tablet TAKE 1 TABLET (100 MG TOTAL) BY MOUTH DAILY.   No facility-administered encounter medications on file as of 09/30/2018.    He is doing well. No refills needed today.  Reviewed labs. TG elevated. Added vascepa for elevated TG.   Due to hx of stroke. I did not see any recent carotid dopplers. I would like to order for screening purposes and risk assessment.   Needs CPE. Needs shingles shot and colonoscopy.   Discussed weight loss.   Follow-up: Return for Needs CPE.   Iran Planas, PA-C

## 2018-10-01 ENCOUNTER — Encounter: Payer: Self-pay | Admitting: Physician Assistant

## 2018-10-01 DIAGNOSIS — Z8673 Personal history of transient ischemic attack (TIA), and cerebral infarction without residual deficits: Secondary | ICD-10-CM | POA: Insufficient documentation

## 2018-11-13 ENCOUNTER — Other Ambulatory Visit: Payer: Self-pay | Admitting: Neurology

## 2018-11-13 ENCOUNTER — Other Ambulatory Visit: Payer: Self-pay | Admitting: Adult Health

## 2018-11-13 DIAGNOSIS — Z8673 Personal history of transient ischemic attack (TIA), and cerebral infarction without residual deficits: Secondary | ICD-10-CM

## 2018-11-13 MED ORDER — CLOPIDOGREL BISULFATE 75 MG PO TABS
75.0000 mg | ORAL_TABLET | Freq: Every day | ORAL | 0 refills | Status: DC
Start: 2018-11-13 — End: 2019-03-12

## 2018-11-13 MED FILL — CLOPIDOGREL 75 MG TABLET: 75 | 90 days supply | Qty: 90 | Fill #0

## 2018-11-27 ENCOUNTER — Other Ambulatory Visit: Payer: 59

## 2018-12-05 ENCOUNTER — Encounter: Payer: 59 | Admitting: Adult Health

## 2019-01-15 DIAGNOSIS — K219 Gastro-esophageal reflux disease without esophagitis: Secondary | ICD-10-CM | POA: Diagnosis not present

## 2019-01-15 DIAGNOSIS — Z86010 Personal history of colonic polyps: Secondary | ICD-10-CM | POA: Diagnosis not present

## 2019-01-15 DIAGNOSIS — K2270 Barrett's esophagus without dysplasia: Secondary | ICD-10-CM | POA: Diagnosis not present

## 2019-02-10 ENCOUNTER — Other Ambulatory Visit: Payer: Self-pay | Admitting: Neurology

## 2019-02-10 ENCOUNTER — Other Ambulatory Visit: Payer: Self-pay | Admitting: Adult Health

## 2019-02-10 DIAGNOSIS — N529 Male erectile dysfunction, unspecified: Secondary | ICD-10-CM

## 2019-02-10 MED ORDER — SERTRALINE HCL 100 MG PO TABS: 100.0000 mg | ORAL_TABLET | Freq: Every day | ORAL | 2 refills | Status: DC

## 2019-02-10 MED FILL — SERTRALINE HCL 100 MG TAB: 100 | 90 days supply | Qty: 90 | Fill #0

## 2019-02-12 MED FILL — GAVILYTE-G SOLUTION: 236 | 2 days supply | Qty: 4000 | Fill #0

## 2019-02-18 DIAGNOSIS — Z1159 Encounter for screening for other viral diseases: Secondary | ICD-10-CM | POA: Diagnosis not present

## 2019-02-21 DIAGNOSIS — Z86010 Personal history of colonic polyps: Secondary | ICD-10-CM | POA: Diagnosis not present

## 2019-02-21 DIAGNOSIS — K2270 Barrett's esophagus without dysplasia: Secondary | ICD-10-CM | POA: Diagnosis not present

## 2019-02-21 DIAGNOSIS — K621 Rectal polyp: Secondary | ICD-10-CM | POA: Diagnosis not present

## 2019-02-21 DIAGNOSIS — K317 Polyp of stomach and duodenum: Secondary | ICD-10-CM | POA: Diagnosis not present

## 2019-02-21 DIAGNOSIS — K449 Diaphragmatic hernia without obstruction or gangrene: Secondary | ICD-10-CM | POA: Diagnosis not present

## 2019-03-12 ENCOUNTER — Ambulatory Visit (INDEPENDENT_AMBULATORY_CARE_PROVIDER_SITE_OTHER): Payer: 59 | Admitting: Physician Assistant

## 2019-03-12 ENCOUNTER — Encounter: Payer: Self-pay | Admitting: Physician Assistant

## 2019-03-12 VITALS — BP 129/71 | HR 66 | Temp 98.5°F | Ht 67.0 in | Wt 190.0 lb

## 2019-03-12 DIAGNOSIS — N529 Male erectile dysfunction, unspecified: Secondary | ICD-10-CM | POA: Diagnosis not present

## 2019-03-12 DIAGNOSIS — E781 Pure hyperglyceridemia: Secondary | ICD-10-CM

## 2019-03-12 DIAGNOSIS — Z8673 Personal history of transient ischemic attack (TIA), and cerebral infarction without residual deficits: Secondary | ICD-10-CM

## 2019-03-12 DIAGNOSIS — Z760 Encounter for issue of repeat prescription: Secondary | ICD-10-CM

## 2019-03-12 MED ORDER — SILDENAFIL CITRATE 50 MG PO TABS: 50.0000 mg | ORAL_TABLET | ORAL | 1 refills | Status: DC | PRN

## 2019-03-12 MED ORDER — ATORVASTATIN CALCIUM 20 MG PO TABS: 20.0000 mg | ORAL_TABLET | Freq: Every day | ORAL | 1 refills | Status: DC

## 2019-03-12 MED ORDER — ICOSAPENT ETHYL 1 G PO CAPS: 2.0000 g | ORAL_CAPSULE | Freq: Two times a day (BID) | ORAL | 3 refills | Status: DC

## 2019-03-12 MED ORDER — CLOPIDOGREL BISULFATE 75 MG PO TABS: 75.0000 mg | ORAL_TABLET | Freq: Every day | ORAL | 1 refills | Status: DC

## 2019-03-12 MED FILL — SILDENAFIL CITRATE 50 MG TA: 50 | 30 days supply | Qty: 6 | Fill #0

## 2019-03-12 MED FILL — CLOPIDOGREL 75 MG TABLET: 75 | 90 days supply | Qty: 90 | Fill #0

## 2019-03-12 MED FILL — VASCEPA 1 GM CAPSULE: 1 | 90 days supply | Qty: 360 | Fill #0

## 2019-03-12 MED FILL — ATORVASTATIN 20 MG TABLET: 20 | 90 days supply | Qty: 90 | Fill #0

## 2019-03-12 NOTE — Progress Notes (Signed)
Needs refill Viagra. Originally prescribed by Dr. Darene Lamer. No other issues. PHQ9-GAD7 completed (0).

## 2019-03-12 NOTE — Progress Notes (Signed)
Patient ID: Zachary Massey, male   DOB: July 10, 1965, 53 y.o.   MRN: [id number] .Marland KitchenVirtual Visit via Video Note  I connected with Zachary Massey on 03/12/19 at  7:10 AM EST by a video enabled telemedicine application and verified that I am speaking with the correct person using two identifiers.  Location: Patient: home Provider: clinic   I discussed the limitations of evaluation and management by telemedicine and the availability of in person appointments. The patient expressed understanding and agreed to proceed.  History of Present Illness: Patient is a 53 year old male with history of stroke, hypertriglyceridemia, hyperlipidemia, erectile dysfunction who calls into the clinic for medication refills.  Patient is doing well.  He denies any concerns or complaints.  Viagra is working very well for him.  He denies any side effects.  He is taking his cholesterol medication as prescribed.  His mood is well controlled.   .. Active Ambulatory Problems    Diagnosis Date Noted  . History of anemia 12/01/2011  . History of TIA (transient ischemic attack) 12/01/2011  . GERD (gastroesophageal reflux disease) 12/03/2011  . Ichthyosis 04/09/2012  . Erectile dysfunction 07/13/2013  . Hypogonadism male 07/13/2013  . Generalized anxiety disorder 07/13/2013  . Premature ejaculation 07/13/2013  . Memory loss 02/03/2015  . Ganglion cyst of wrist, left 02/25/2016  . Vitamin D deficiency 06/23/2016  . Hyperlipidemia 06/23/2016  . Screening for diabetes mellitus 06/23/2016  . Fatigue 06/23/2016  . Health care maintenance 06/23/2016  . Screening for colon cancer 08/15/2017  . Oral pain 07/17/2018  . 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 Hypertriglyceridemia 09/30/2018  . History of stroke 10/01/2018   Resolved Ambulatory Problems    Diagnosis Date Noted  . MCI (mild cognitive impairment) 02/03/2015  . Bronchitis, acute 12/28/2016   Past Medical  History:  Diagnosis Date  . Anxiety   . Depression   . Stroke ([place]) 03/13/2006  . TIA (transient ischemic attack)    Reviewed med, allergy, problem list.      Observations/Objective: No acute distress. Normal mood and appearance.   .. Today's Vitals   03/12/19 0657  BP: 129/71  Pulse: 66  Temp: 98.5 F (36.9 C)  TempSrc: Oral  SpO2: 96%  Weight: 190 lb (86.2 kg)  Height: 5\' 7"  (1.702 m)   Body mass index is 29.76 kg/m.  .. Depression screen [facility] 2/9 03/12/2019 07/17/2018 12/28/2016 04/07/2015 12/22/2014  Decreased Interest 0 0 0 0 0  Down, Depressed, Hopeless 0 1 0 0 0  PHQ - 2 Score 0 1 0 0 0  Altered sleeping - 1 - - -  Tired, decreased energy - 1 - - -  Change in appetite - 0 - - -  Feeling bad or failure about yourself  - 1 - - -  Trouble concentrating - 0 - - -  Moving slowly or fidgety/restless - 0 - - -  Suicidal thoughts - 0 - - -  PHQ-9 Score - 4 - - -  Difficult doing work/chores - Not difficult at all - - -   .Marland Kitchen GAD 7 : Generalized Anxiety Score 03/12/2019  Nervous, Anxious, on Edge 0  Control/stop worrying 0  Worry too much - different things 0  Trouble relaxing 0  Restless 0  Easily annoyed or irritable 0  Afraid - awful might happen 0  Total GAD 7 Score 0  Anxiety Difficulty Not difficult at all       Assessment and Plan: Marland KitchenMarland KitchenDiagnoses  and all orders for this visit:  Erectile dysfunction, unspecified erectile dysfunction type -     sildenafil (VIAGRA) 50 MG tablet; Take 1 tablet (50 mg total) by mouth as needed for erectile dysfunction.  H/O TIA (transient ischemic attack) and stroke -     icosapent Ethyl (VASCEPA) 1 g capsule; Take 2 capsules (2 g total) by mouth 2 (two) times daily. -     clopidogrel (PLAVIX) 75 MG tablet; Take 1 tablet (75 mg total) by mouth daily. -     atorvastatin (LIPITOR) 20 MG tablet; Take 1 tablet (20 mg total) by mouth daily.  Hypertriglyceridemia -     icosapent Ethyl (VASCEPA) 1 g capsule; Take 2 capsules (2  g total) by mouth 2 (two) times daily. -     atorvastatin (LIPITOR) 20 MG tablet; Take 1 tablet (20 mg total) by mouth daily.   Labs up to date.  TG remain elevated. Added vascepa to further reduce CV risk and get TG to goal. Continue lipitor.  Refilled medications.   Refilled viagra. Pt has no concerns or complaints. Doing well. Pt did not improve with testosterone therapy.  Follow up in 6 months.    Follow Up Instructions:    I discussed the assessment and treatment plan with the patient. The patient was provided an opportunity to ask questions and all were answered. The patient agreed with the plan and demonstrated an understanding of the instructions.   The patient was advised to call back or seek an in-person evaluation if the symptoms worsen or if the condition fails to improve as anticipated.    Iran Planas, PA-C

## 2019-05-09 ENCOUNTER — Encounter: Payer: Self-pay | Admitting: Physician Assistant

## 2019-05-09 ENCOUNTER — Ambulatory Visit (INDEPENDENT_AMBULATORY_CARE_PROVIDER_SITE_OTHER): Payer: 59

## 2019-05-09 ENCOUNTER — Ambulatory Visit (INDEPENDENT_AMBULATORY_CARE_PROVIDER_SITE_OTHER): Payer: 59 | Admitting: Physician Assistant

## 2019-05-09 ENCOUNTER — Other Ambulatory Visit: Payer: Self-pay

## 2019-05-09 VITALS — BP 137/80 | HR 60 | Ht 67.0 in | Wt 203.0 lb

## 2019-05-09 DIAGNOSIS — M79644 Pain in right finger(s): Secondary | ICD-10-CM

## 2019-05-09 DIAGNOSIS — S6991XA Unspecified injury of right wrist, hand and finger(s), initial encounter: Secondary | ICD-10-CM

## 2019-05-09 DIAGNOSIS — S63282A Dislocation of proximal interphalangeal joint of right middle finger, initial encounter: Secondary | ICD-10-CM | POA: Diagnosis not present

## 2019-05-09 NOTE — Progress Notes (Signed)
Xray does show soft tissue swelling over joint and looks like a small avulsion fracture. Dr. Darene Lamer in office would like to see him.

## 2019-05-09 NOTE — Progress Notes (Signed)
Would you look at xray. Seen this morning 2 months since right PIP injury(flexion jam putting christmas decorations up)  Its been so long. It is swollen and had to shut his fist. Would you consider injection for arthritis? Send him to you?

## 2019-05-09 NOTE — Progress Notes (Signed)
   Subjective:    Patient ID: Zachary Massey, male    DOB: 1965/08/23, 54 y.o.   MRN: [id number]  HPI  Pt is a 54 yo male who presents to the clinic with swollen right PIP joint after jammed incident putting up christmas decorations. He thought he pulled it back in place but it has continued to swell and joint get larger. He is finding it hard to grasp completely with right hand. No major redness. Pain is intermittent and more of a discomfort. Not taking anything to make better.   .. Active Ambulatory Problems    Diagnosis Date Noted  . History of anemia 12/01/2011  . History of TIA (transient ischemic attack) 12/01/2011  . GERD (gastroesophageal reflux disease) 12/03/2011  . Ichthyosis 04/09/2012  . Erectile dysfunction 07/13/2013  . Hypogonadism male 07/13/2013  . Generalized anxiety disorder 07/13/2013  . Premature ejaculation 07/13/2013  . Memory loss 02/03/2015  . Ganglion cyst of wrist, left 02/25/2016  . Vitamin D deficiency 06/23/2016  . Hyperlipidemia 06/23/2016  . Screening for diabetes mellitus 06/23/2016  . Fatigue 06/23/2016  . Health care maintenance 06/23/2016  . Screening for colon cancer 08/15/2017  . Oral pain 07/17/2018  . 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 Hypertriglyceridemia 09/30/2018  . History of stroke 10/01/2018   Resolved Ambulatory Problems    Diagnosis Date Noted  . MCI (mild cognitive impairment) 02/03/2015  . Bronchitis, acute 12/28/2016   Past Medical History:  Diagnosis Date  . Anxiety   . Depression   . Stroke ([place]) 03/13/2006  . TIA (transient ischemic attack)        Review of Systems See HPI.     Objective:   Physical Exam Vitals reviewed.  Constitutional:      Appearance: Normal appearance.  Musculoskeletal:     Comments: Right PIP middle finger swollen and painful to direct palpation. No redness or warmth. Able to fully extend but not to flex.   Neurological:     Mental  Status: He is alert.           Assessment & Plan:  Marland KitchenMarland KitchenIsaiahs was seen today for finger injury.  Diagnoses and all orders for this visit:  Pain of right middle finger -     DG Finger Middle Right  Jammed interphalangeal joint of finger of right hand, initial encounter   Has been 2 months. Will get xray. Could be some arthritis forming over jam. Will see if any major malalignment. Consider injection with Dr. Darene Lamer in future. Ice and NSAIDs as needed.

## 2019-05-16 ENCOUNTER — Other Ambulatory Visit: Payer: Self-pay

## 2019-05-16 ENCOUNTER — Encounter: Payer: Self-pay | Admitting: Sports Medicine

## 2019-05-16 ENCOUNTER — Ambulatory Visit (INDEPENDENT_AMBULATORY_CARE_PROVIDER_SITE_OTHER): Payer: 59 | Admitting: Sports Medicine

## 2019-05-16 DIAGNOSIS — M12541 Traumatic arthropathy, right hand: Secondary | ICD-10-CM

## 2019-05-16 NOTE — Assessment & Plan Note (Signed)
4 months ago this pleasant 54 year old male fell off of a ladder and dislocated his right third PIP. He reduced the finger himself and never sought medical care. 4 months later he continues to have pain, swelling, and stiffness. X-rays do show sequelae of an old volar plate injury at the PIP, he has good strength, overall good range of motion. There is no instability of collaterals, x-rays however do show significant PIP osteoarthritis. Today we injected the joint with ultrasound guidance, home hand physical therapy given. Return to see me in 4 weeks, MRI and formal hand therapy if no better.

## 2019-05-16 NOTE — Progress Notes (Signed)
    Procedures performed today:    Procedure: Real-time Ultrasound Guided injection of the right third PIP Device: Samsung HS60  Verbal informed consent obtained.  Time-out conducted.  Noted no overlying erythema, induration, or other signs of local infection.  Skin prepped in a sterile fashion.  Local anesthesia: Topical Ethyl chloride.  With sterile technique and under real time ultrasound guidance:  1/2 cc Kenalog 40, 1/2 cc lidocaine injected easily completed without difficulty  Pain immediately resolved suggesting accurate placement of the medication.  Advised to call if fevers/chills, erythema, induration, drainage, or persistent bleeding.  Images permanently stored and available for review in the ultrasound unit.  Impression: Technically successful ultrasound guided injection.  Independent interpretation of notes and tests performed by another provider:   X-rays personally reviewed and do show sequelae of an old volar plate injury at the PIP, he has good strength, overall good range of motion.  There is no instability of collaterals, x-rays however do show significant PIP osteoarthritis.  Impression and Recommendations:    Posttraumatic osteoarthritis of right third PIP 4 months ago this pleasant 54 year old male fell off of a ladder and dislocated his right third PIP. He reduced the finger himself and never sought medical care. 4 months later he continues to have pain, swelling, and stiffness. X-rays do show sequelae of an old volar plate injury at the PIP, he has good strength, overall good range of motion. There is no instability of collaterals, x-rays however do show significant PIP osteoarthritis. Today we injected the joint with ultrasound guidance, home hand physical therapy given. Return to see me in 4 weeks, MRI and formal hand therapy if no better.    ___________________________________________ Gwen Her. Dianah Field, M.D., ABFM., CAQSM. Primary Care and [place] Instructor of [place] [employer] [facility] [employer]

## 2019-06-06 ENCOUNTER — Ambulatory Visit: Payer: 59

## 2019-06-09 ENCOUNTER — Ambulatory Visit: Payer: 59 | Attending: Internal Medicine

## 2019-06-09 DIAGNOSIS — Z20822 Contact with and (suspected) exposure to covid-19: Secondary | ICD-10-CM

## 2019-06-10 LAB — NOVEL CORONAVIRUS, NAA: SARS-CoV-2, NAA: NOT DETECTED

## 2019-06-10 LAB — SARS-COV-2, NAA 2 DAY TAT

## 2019-06-14 ENCOUNTER — Ambulatory Visit: Payer: 59 | Attending: Internal Medicine

## 2019-06-14 DIAGNOSIS — Z23 Encounter for immunization: Secondary | ICD-10-CM

## 2019-06-14 NOTE — Progress Notes (Signed)
   Covid-19 Vaccination Clinic  Name:  Zachary Massey    MRN: [id number] DOB: 07-07-1965  06/14/2019  [name] was observed post Covid-19 immunization for 15 minutes without incident. He was provided with Vaccine Information Sheet and instruction to access the V-Safe system.   [name] was instructed to call 911 with any severe reactions post vaccine: Marland Kitchen Difficulty breathing  . Swelling of face and throat  . A fast heartbeat  . A bad rash all over body  . Dizziness and weakness   Immunizations Administered    Name Date Dose VIS Date Route   Pfizer COVID-19 Vaccine 06/14/2019  8:32 AM 0.3 mL 02/21/2019 Intramuscular   Manufacturer: [place]   Lot: [id number]   [place]: [id number]

## 2019-06-16 ENCOUNTER — Ambulatory Visit: Payer: 59 | Admitting: Sports Medicine

## 2019-06-25 MED FILL — SERTRALINE HCL 100 MG TAB: 100 | 90 days supply | Qty: 90 | Fill #1

## 2019-07-08 ENCOUNTER — Ambulatory Visit: Payer: 59 | Attending: Internal Medicine

## 2019-07-08 DIAGNOSIS — Z23 Encounter for immunization: Secondary | ICD-10-CM

## 2019-07-08 NOTE — Progress Notes (Signed)
   Covid-19 Vaccination Clinic  Name:  Zachary Massey    MRN: [id number] DOB: 1965/05/01  07/08/2019  [name] was observed post Covid-19 immunization for 15 minutes without incident. He was provided with Vaccine Information Sheet and instruction to access the V-Safe system.   [name] was instructed to call 911 with any severe reactions post vaccine: Marland Kitchen Difficulty breathing  . Swelling of face and throat  . A fast heartbeat  . A bad rash all over body  . Dizziness and weakness   Immunizations Administered    Name Date Dose VIS Date Route   Pfizer COVID-19 Vaccine 07/08/2019  1:58 PM 0.3 mL 05/07/2018 Intramuscular   Manufacturer: [place]   Lot: [id number]   [place]: [id number]

## 2019-09-17 MED FILL — CLOPIDOGREL 75 MG TABLET: 75 | 90 days supply | Qty: 90 | Fill #1

## 2019-12-08 MED FILL — SERTRALINE HCL 100 MG TABS: 100 | 90 days supply | Qty: 90 | Fill #2

## 2019-12-10 ENCOUNTER — Ambulatory Visit (INDEPENDENT_AMBULATORY_CARE_PROVIDER_SITE_OTHER): Payer: 59

## 2019-12-10 ENCOUNTER — Ambulatory Visit (INDEPENDENT_AMBULATORY_CARE_PROVIDER_SITE_OTHER): Payer: 59 | Admitting: Physician Assistant

## 2019-12-10 ENCOUNTER — Other Ambulatory Visit: Payer: Self-pay

## 2019-12-10 ENCOUNTER — Other Ambulatory Visit: Payer: Self-pay | Admitting: Physician Assistant

## 2019-12-10 VITALS — BP 143/92 | HR 69 | Ht 67.0 in | Wt 204.0 lb

## 2019-12-10 DIAGNOSIS — Z23 Encounter for immunization: Secondary | ICD-10-CM | POA: Diagnosis not present

## 2019-12-10 DIAGNOSIS — M25561 Pain in right knee: Secondary | ICD-10-CM | POA: Diagnosis not present

## 2019-12-10 DIAGNOSIS — M25522 Pain in left elbow: Secondary | ICD-10-CM

## 2019-12-10 DIAGNOSIS — G8929 Other chronic pain: Secondary | ICD-10-CM | POA: Diagnosis not present

## 2019-12-10 DIAGNOSIS — M1711 Unilateral primary osteoarthritis, right knee: Secondary | ICD-10-CM | POA: Diagnosis not present

## 2019-12-10 DIAGNOSIS — R030 Elevated blood-pressure reading, without diagnosis of hypertension: Secondary | ICD-10-CM | POA: Diagnosis not present

## 2019-12-10 MED ORDER — CELECOXIB 200 MG PO CAPS: ORAL_CAPSULE | ORAL | 5 refills | Status: DC

## 2019-12-10 MED FILL — CELECOXIB 200 MG CAP: 200 | 30 days supply | Qty: 60 | Fill #0

## 2019-12-10 NOTE — Patient Instructions (Signed)
Patellofemoral Pain Syndrome  Patellofemoral pain syndrome is a condition in which the tissue (cartilage) on the underside of the kneecap (patella) softens or breaks down. This causes pain in the front of the knee. The condition is also called runner's knee or chondromalacia patella. Patellofemoral pain syndrome is most common in young adults who are active in sports. The knee is the largest joint in the body. The patella covers the front of the knee and is attached to muscles above and below the knee. The underside of the patella is covered with a smooth type of cartilage (synovium). The smooth surface helps the patella to glide easily when you move your knee. Patellofemoral pain syndrome causes swelling in the joint linings and bone surfaces in the knee. What are the causes? This condition may be caused by:  Overuse of the knee.  Poor alignment of your knee joints.  Weak leg muscles.  A direct blow to your kneecap. What increases the risk? You are more likely to develop this condition if:  You do a lot of activities that can wear down your kneecap. These include: ? Running. ? Squatting. ? Climbing stairs.  You start a new physical activity or exercise program.  You wear shoes that do not fit well.  You do not have good leg strength.  You are overweight. What are the signs or symptoms? The main symptom of this condition is knee pain. This may feel like a dull, aching pain underneath your patella, in the front of your knee. There may be a popping or cracking sound when you move your knee. Pain may get worse with:  Exercise.  Climbing stairs.  Running.  Jumping.  Squatting.  Kneeling.  Sitting for a long time.  Moving or pushing on your patella. How is this diagnosed? This condition may be diagnosed based on:  Your symptoms and medical history. You may be asked about your recent physical activities and which ones cause knee pain.  A physical exam. This may  include: ? Moving your patella back and forth. ? Checking your range of knee motion. ? Having you squat or jump to see if you have pain. ? Checking the strength of your leg muscles.  Imaging tests to confirm the diagnosis. These may include an MRI of your knee. How is this treated? This condition may be treated at home with rest, ice, compression, and elevation (RICE).  Other treatments may include:  Nonsteroidal anti-inflammatory drugs (NSAIDs).  Physical therapy to stretch and strengthen your leg muscles.  Shoe inserts (orthotics) to take stress off your knee.  A knee brace or knee support.  Adhesive tapes to the skin.  Surgery to remove damaged cartilage or move the patella to a better position. This is rare. Follow these instructions at home: If you have a shoe or brace:  Wear the shoe or brace as told by your health care provider. Remove it only as told by your health care provider.  Loosen the shoe or brace if your toes tingle, become numb, or turn cold and blue.  Keep the shoe or brace clean.  If the shoe or brace is not waterproof: ? Do not let it get wet. ? Cover it with a watertight covering when you take a bath or a shower. Managing pain, stiffness, and swelling  If directed, put ice on the painful area. ? If you have a removable shoe or brace, remove it as told by your health care provider. ? Put ice in a plastic bag. ?  Place a towel between your skin and the bag. ? Leave the ice on for 20 minutes, 2-3 times a day.  Move your toes often to avoid stiffness and to lessen swelling.  Rest your knee: ? Avoid activities that cause knee pain. ? When sitting or lying down, raise (elevate) the injured area above the level of your heart, whenever possible. General instructions  Take over-the-counter and prescription medicines only as told by your health care provider.  Use splints, braces, knee supports, or walking aids as directed by your health care  provider.  Perform stretching and strengthening exercises as told by your health care provider or physical therapist.  Do not use any products that contain nicotine or tobacco, such as cigarettes and e-cigarettes. These can delay healing. If you need help quitting, ask your health care provider.  Return to your normal activities as told by your health care provider. Ask your health care provider what activities are safe for you.  Keep all follow-up visits as told by your health care provider. This is important. Contact a health care provider if:  Your symptoms get worse.  You are not improving with home care. Summary  Patellofemoral pain syndrome is a condition in which the tissue (cartilage) on the underside of the kneecap (patella) softens or breaks down.  This condition causes swelling in the joint linings and bone surfaces in the knee. This leads to pain in the front of the knee.  This condition may be treated at home with rest, ice, compression, and elevation (RICE).  Use splints, braces, knee supports, or walking aids as directed by your health care provider. This information is not intended to replace advice given to you by your health care provider. Make sure you discuss any questions you have with your health care provider. Document Revised: 04/09/2017 Document Reviewed: 04/09/2017 Elsevier Patient Education  2020 [employer] Inc.  

## 2019-12-10 NOTE — Progress Notes (Signed)
Subjective:    Patient ID: Zachary Massey, male    DOB: January 15, 1966, 54 y.o.   MRN: [id number]  HPI  Pt is a 54 yo male who presents to the clinic with right knee pain and left elbow pain. Right knee pain and left elbow pain ongoing for years. Some times worse than others. Not taking anything regularly for it. He took his wife Zachary Massey and helped a lot. Ibuprofen does help as well. His right knee is worse when getting up and down or going up a flight of stairs. He is up and down a lot at work. No injury. His left elbow hurts with overuse.   .. Active Ambulatory Problems    Diagnosis Date Noted  . History of anemia 12/01/2011  . History of TIA (transient ischemic attack) 12/01/2011  . GERD (gastroesophageal reflux disease) 12/03/2011  . Ichthyosis 04/09/2012  . Erectile dysfunction 07/13/2013  . Hypogonadism male 07/13/2013  . Generalized anxiety disorder 07/13/2013  . Premature ejaculation 07/13/2013  . Memory loss 02/03/2015  . Ganglion cyst of wrist, left 02/25/2016  . Vitamin D deficiency 06/23/2016  . Hyperlipidemia 06/23/2016  . Screening for diabetes mellitus 06/23/2016  . Fatigue 06/23/2016  . Health care maintenance 06/23/2016  . Screening for colon cancer 08/15/2017  . Oral pain 07/17/2018  . 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 Hypertriglyceridemia 09/30/2018  . History of stroke 10/01/2018  . Posttraumatic osteoarthritis of right third PIP 05/16/2019  . 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  . Elevated blood pressure reading 12/12/2019  . Chronic pain of right knee 12/12/2019  . Left elbow pain 12/12/2019   Resolved Ambulatory Problems    Diagnosis Date Noted  . MCI (mild cognitive impairment) 02/03/2015  . Bronchitis, acute 12/28/2016   Past Medical History:  Diagnosis Date  . Anxiety   . Depression   . Stroke ([place]) 03/13/2006  . TIA (transient ischemic attack)      Review of Systems See HPI.    Objective:    Physical Exam Vitals reviewed.  Constitutional:      Appearance: Normal appearance. He is obese.  HENT:     Head: Normocephalic.  Cardiovascular:     Rate and Rhythm: Normal rate and regular rhythm.     Pulses: Normal pulses.  Pulmonary:     Effort: Pulmonary effort is normal.  Musculoskeletal:     Comments: Left elbow: No tenderness over epicondyle.  NROM.  Tenderness over olecranon.  No edema.  Right knee: Normal ROM with some crepitus.  No edema.  No laxity. Negative mcmurrays Strength 5/5.  No joint tenderness.  Some discomfort with palpation over patella.   Neurological:     General: No focal deficit present.     Mental Status: He is alert and oriented to person, place, and time.  Psychiatric:        Mood and Affect: Mood normal.           Assessment & Plan:  Marland KitchenMarland KitchenBerl was seen today for joint pain.  Diagnoses and all orders for this visit:  Chronic pain of right knee -     celecoxib (Zachary Massey) 200 MG capsule; One tablet twice a day. -     DG Knee Complete 4 Views Right  Flu vaccine need -     Flu Vaccine QUAD 36+ mos IM  Chronic elbow pain, left -     celecoxib (Zachary Massey) 200 MG capsule; One tablet twice a day. -  DG Elbow Complete Left  Elevated blood pressure reading   Would like to get x-rays of the knee and elbow.  Suspect more arthritis changes.  Her knee could be some patellofemoral syndrome.  Suggested patient to get a patellar strap.  Started Zachary Massey daily.ice areas when needed. Avoid overuse as much as you can. GRF great from last lab. He might respond well to injections with sports medicine in the future. HO given with stretches and exercises.   BP elevated today. Continue to watch this. Zachary Massey could increase this as well. Follow up in 1-2 months. Check BP at home. Discussed low salt diet and exercise.

## 2019-12-11 ENCOUNTER — Encounter: Payer: Self-pay | Admitting: Physician Assistant

## 2019-12-12 ENCOUNTER — Encounter: Payer: Self-pay | Admitting: Physician Assistant

## 2019-12-12 DIAGNOSIS — M25561 Pain in right knee: Secondary | ICD-10-CM | POA: Insufficient documentation

## 2019-12-12 DIAGNOSIS — M19022 Primary osteoarthritis, left elbow: Secondary | ICD-10-CM | POA: Insufficient documentation

## 2019-12-12 DIAGNOSIS — G8929 Other chronic pain: Secondary | ICD-10-CM | POA: Insufficient documentation

## 2019-12-12 DIAGNOSIS — R030 Elevated blood-pressure reading, without diagnosis of hypertension: Secondary | ICD-10-CM | POA: Insufficient documentation

## 2019-12-12 DIAGNOSIS — M25522 Pain in left elbow: Secondary | ICD-10-CM | POA: Insufficient documentation

## 2019-12-12 NOTE — Progress Notes (Signed)
Altamese Dilling,   Arthritic changes are noted. I do think you would likely benefit from injection with Dr. Darene Lamer if celebrex not helping to reduce pain.

## 2019-12-12 NOTE — Telephone Encounter (Signed)
We have requested records and are waiting to receive. Sent request after last visit.

## 2019-12-12 NOTE — Progress Notes (Signed)
Osteoarthritis in right knee. Treatment plan stays the same.

## 2019-12-30 ENCOUNTER — Ambulatory Visit: Payer: 59 | Admitting: Physician Assistant

## 2020-01-07 ENCOUNTER — Encounter: Payer: Self-pay | Admitting: Physician Assistant

## 2020-01-07 DIAGNOSIS — K635 Polyp of colon: Secondary | ICD-10-CM | POA: Insufficient documentation

## 2020-01-30 ENCOUNTER — Other Ambulatory Visit: Payer: Self-pay | Admitting: Physician Assistant

## 2020-01-30 DIAGNOSIS — Z8673 Personal history of transient ischemic attack (TIA), and cerebral infarction without residual deficits: Secondary | ICD-10-CM

## 2020-01-30 MED FILL — CLOPIDOGREL 75 MG TABLET: 75 | 90 days supply | Qty: 90 | Fill #0

## 2020-01-30 NOTE — Telephone Encounter (Signed)
Last written 03/12/2019 #90 with 1 refill Last appt discussing medication was in December last year  Should he still be taking?

## 2020-03-23 ENCOUNTER — Ambulatory Visit: Payer: 59

## 2020-04-12 ENCOUNTER — Other Ambulatory Visit: Payer: Self-pay

## 2020-04-12 DIAGNOSIS — Z20822 Contact with and (suspected) exposure to covid-19: Secondary | ICD-10-CM

## 2020-04-13 LAB — NOVEL CORONAVIRUS, NAA: SARS-CoV-2, NAA: DETECTED — AB

## 2020-04-13 LAB — SARS-COV-2, NAA 2 DAY TAT

## 2020-04-14 ENCOUNTER — Ambulatory Visit (HOSPITAL_COMMUNITY)
Admission: RE | Admit: 2020-04-14 | Discharge: 2020-04-14 | Disposition: A | Discharge status: Home / Self Care | Payer: No Typology Code available for payment source | Source: Ambulatory Visit | Attending: Pulmonary Disease | Admitting: Pulmonary Disease

## 2020-04-14 ENCOUNTER — Ambulatory Visit: Payer: Self-pay

## 2020-04-14 ENCOUNTER — Other Ambulatory Visit: Payer: Self-pay | Admitting: Physician Assistant

## 2020-04-14 DIAGNOSIS — U071 COVID-19: Secondary | ICD-10-CM | POA: Diagnosis not present

## 2020-04-14 DIAGNOSIS — Z8673 Personal history of transient ischemic attack (TIA), and cerebral infarction without residual deficits: Secondary | ICD-10-CM

## 2020-04-14 DIAGNOSIS — E782 Mixed hyperlipidemia: Secondary | ICD-10-CM

## 2020-04-14 MED ORDER — SOTROVIMAB 500 MG/8ML IV SOLN
500.0000 mg | Freq: Once | INTRAVENOUS | Status: AC
  Administered 2020-04-14: 500 mg via INTRAVENOUS

## 2020-04-14 MED ORDER — FAMOTIDINE IN NACL 20-0.9 MG/50ML-% IV SOLN: 20.0000 mg | Freq: Once | INTRAVENOUS | Status: DC | PRN

## 2020-04-14 MED ORDER — DIPHENHYDRAMINE HCL 50 MG/ML IJ SOLN: 50.0000 mg | Freq: Once | INTRAMUSCULAR | Status: DC | PRN

## 2020-04-14 MED ORDER — SODIUM CHLORIDE 0.9 % IV SOLN: INTRAVENOUS | Status: DC | PRN

## 2020-04-14 MED ORDER — EPINEPHRINE 0.3 MG/0.3ML IJ SOAJ: 0.3000 mg | Freq: Once | INTRAMUSCULAR | Status: DC | PRN

## 2020-04-14 MED ORDER — METHYLPREDNISOLONE SODIUM SUCC 125 MG IJ SOLR: 125.0000 mg | Freq: Once | INTRAMUSCULAR | Status: DC | PRN

## 2020-04-14 MED ORDER — ALBUTEROL SULFATE HFA 108 (90 BASE) MCG/ACT IN AERS: 2.0000 | INHALATION_SPRAY | Freq: Once | RESPIRATORY_TRACT | Status: DC | PRN

## 2020-04-14 NOTE — Progress Notes (Signed)
I connected by phone with Zachary Massey on 04/14/2020 at 10:10 AM to discuss the potential use of a new treatment for mild to moderate COVID-19 viral infection in non-hospitalized patients.  This patient is a 55 y.o. male that meets the FDA criteria for Emergency Use Authorization of COVID monoclonal antibody sotrovimab.  Has a (+) direct SARS-CoV-2 viral test result  Has mild or moderate COVID-19   Is NOT hospitalized due to COVID-19  Is within 10 days of symptom onset  Has at least one of the high risk factor(s) for progression to severe COVID-19 and/or hospitalization as defined in EUA.  Specific high risk criteria : BMI > 25 and Other high risk medical condition per CDC:  hx of stroke. Tier 1 and high SVI   I have spoken and communicated the following to the patient or parent/caregiver regarding COVID monoclonal antibody treatment:  1. FDA has authorized the emergency use for the treatment of mild to moderate COVID-19 in adults and pediatric patients with positive results of direct SARS-CoV-2 viral testing who are 31 years of age and older weighing at least 40 kg, and who are at high risk for progressing to severe COVID-19 and/or hospitalization.  2. The significant known and potential risks and benefits of COVID monoclonal antibody, and the extent to which such potential risks and benefits are unknown.  3. Information on available alternative treatments and the risks and benefits of those alternatives, including clinical trials.  4. Patients treated with COVID monoclonal antibody should continue to self-isolate and use infection control measures (e.g., wear mask, isolate, social distance, avoid sharing personal items, clean and disinfect "high touch" surfaces, and frequent handwashing) according to CDC guidelines.   5. The patient or parent/caregiver has the option to accept or refuse COVID monoclonal antibody treatment.  After reviewing this information with the patient, the patient  has agreed to receive one of the available covid 19 monoclonal antibodies and will be provided an appropriate fact sheet prior to infusion.   [place], [place] 04/14/2020 10:10 AM

## 2020-04-14 NOTE — Progress Notes (Signed)
Diagnosis: COVID-19  Physician: Dr. Patrick Wright  Procedure: [facility] Med: Sotrovimab infusion - Provided patient with sotrovimab fact sheet for patients, parents, and caregivers prior to infusion.   Complications: No immediate complications noted  Discharge: Discharged home    

## 2020-04-14 NOTE — Discharge Instructions (Signed)

## 2020-04-14 NOTE — Progress Notes (Signed)
Patient reviewed Fact Sheet for Patients, Parents, and Caregivers for Emergency Use Authorization (EUA) of sotrovimab for the Treatment of Coronavirus. Patient also reviewed and is agreeable to the estimated cost of treatment. Patient is agreeable to proceed.   

## 2020-04-20 ENCOUNTER — Other Ambulatory Visit: Payer: Self-pay | Admitting: Physician Assistant

## 2020-04-20 DIAGNOSIS — N529 Male erectile dysfunction, unspecified: Secondary | ICD-10-CM

## 2020-04-20 MED FILL — SERTRALINE HCL 100 MG TABS: 100 | 30 days supply | Qty: 30 | Fill #0

## 2020-04-20 MED FILL — CELECOXIB 200 MG CAP: 200 | 30 days supply | Qty: 60 | Fill #1

## 2020-05-01 ENCOUNTER — Emergency Department (INDEPENDENT_AMBULATORY_CARE_PROVIDER_SITE_OTHER): Payer: No Typology Code available for payment source

## 2020-05-01 ENCOUNTER — Encounter: Payer: Self-pay | Admitting: Emergency Medicine

## 2020-05-01 ENCOUNTER — Other Ambulatory Visit: Payer: Self-pay

## 2020-05-01 ENCOUNTER — Emergency Department (INDEPENDENT_AMBULATORY_CARE_PROVIDER_SITE_OTHER)
Admission: EM | Admit: 2020-05-01 | Discharge: 2020-05-01 | Disposition: A | Discharge status: Home / Self Care | Payer: No Typology Code available for payment source | Source: Home / Self Care

## 2020-05-01 DIAGNOSIS — W19XXXA Unspecified fall, initial encounter: Secondary | ICD-10-CM | POA: Diagnosis not present

## 2020-05-01 DIAGNOSIS — M79605 Pain in left leg: Secondary | ICD-10-CM | POA: Diagnosis not present

## 2020-05-01 DIAGNOSIS — W11XXXA Fall on and from ladder, initial encounter: Secondary | ICD-10-CM | POA: Diagnosis not present

## 2020-05-01 DIAGNOSIS — S70312A Abrasion, left thigh, initial encounter: Secondary | ICD-10-CM | POA: Diagnosis not present

## 2020-05-01 MED ORDER — SILVER SULFADIAZINE 1 % EX CREA: 1.0000 "application " | TOPICAL_CREAM | Freq: Two times a day (BID) | CUTANEOUS | 0 refills | Status: AC

## 2020-05-01 MED ORDER — CYCLOBENZAPRINE HCL 10 MG PO TABS: 10.0000 mg | ORAL_TABLET | Freq: Two times a day (BID) | ORAL | 0 refills | Status: DC | PRN

## 2020-05-01 NOTE — Discharge Instructions (Addendum)
Change dressing twice daily and apply Silvadene cream twice daily for total 5 days.  I have prescribed you cyclobenzaprine take tonight prior to bedtime as you may have pain.  You may take up to twice daily however avoid driving or doing any activity that requires attention as medication can cause severe drowsiness.  You may take the Celebrex along with the cyclobenzaprine without any interaction.

## 2020-05-01 NOTE — ED Triage Notes (Signed)
Patient was on 15 foot ladder that came down and patient landed on a brick wall.  Injured left leg/thigh area.  No loss of consciousness.

## 2020-05-01 NOTE — ED Provider Notes (Signed)
[name]    CSN: [id number] Arrival date & time: 05/01/20  1427      History   Chief Complaint Chief Complaint  Patient presents with  . Fall    HPI Zachary Massey is a 55 y.o. male.   HPI  Patient presents today for evaluation of left eye injury related to a fall from a 13 foot ladder which occurred less than an hour ago.  Patient was working and subsequently lost his balance falling on a cement wall.  He is able to walk however endorses some pain at the site in which the cement wall may contact with his thigh.  He has a significant abrasion but no deep laceration.  Patient is also on Plavix therapy and he is not bleeding any denies hitting his head.  Denies any chest pain or shortness of breath.  Past Medical History:  Diagnosis Date  . Anxiety   . Depression   . Erectile dysfunction   . GERD (gastroesophageal reflux disease)   . GERD (gastroesophageal reflux disease)   . Hypogonadism male   . Premature ejaculation   . Stroke ([place]) 03/13/2006   TIA (aphasia; admitted; Plavix therapy; maintained on Plavix.  Marland Kitchen TIA (transient ischemic attack)     Patient Active Problem List   Diagnosis Date Noted  . Colon polyp 01/07/2020  . 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  . Elevated blood pressure reading 12/12/2019  . Chronic pain of right knee 12/12/2019  . Left elbow pain 12/12/2019  . Posttraumatic osteoarthritis of right third PIP 05/16/2019  . History of stroke 10/01/2018  . Hypertriglyceridemia 09/30/2018  . 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 Oral pain 07/17/2018  . Screening for colon cancer 08/15/2017  . Vitamin D deficiency 06/23/2016  . Hyperlipidemia 06/23/2016  . Screening for diabetes mellitus 06/23/2016  . Fatigue 06/23/2016  . Health care maintenance 06/23/2016  . Ganglion cyst of wrist, left 02/25/2016  . Memory loss 02/03/2015  . Erectile dysfunction 07/13/2013  . Hypogonadism male 07/13/2013   . Generalized anxiety disorder 07/13/2013  . Premature ejaculation 07/13/2013  . Ichthyosis 04/09/2012  . GERD (gastroesophageal reflux disease) 12/03/2011  . History of anemia 12/01/2011  . History of TIA (transient ischemic attack) 12/01/2011    Past Surgical History:  Procedure Laterality Date  . VASECTOMY         Home Medications    Prior to Admission medications   Medication Sig Start Date End Date Taking? Authorizing Provider  celecoxib (CELEBREX) 200 MG capsule One tablet twice a day. 12/10/19  Yes Breeback, Jade L, PA-C  clopidogrel (PLAVIX) 75 MG tablet TAKE 1 TABLET (75 MG TOTAL) BY MOUTH DAILY. 01/30/20  Yes Breeback, Jade L, PA-C  icosapent Ethyl (VASCEPA) 1 g capsule Take 2 capsules (2 g total) by mouth 2 (two) times daily. 03/12/19  Yes Breeback, Jade L, PA-C  Omeprazole-Sodium Bicarbonate (ZEGERID OTC PO) Take 2 tablets by mouth daily.   Yes [provider]  sertraline (ZOLOFT) 100 MG tablet Take 1 tablet (100 mg total) by mouth daily. Needs appt 04/20/20  Yes Breeback, Jade L, PA-C  sildenafil (VIAGRA) 50 MG tablet Take 1 tablet (50 mg total) by mouth as needed for erectile dysfunction. 03/12/19  Yes Breeback, Royetta Car, PA-C    Family History Family History  Problem Relation Age of Onset  . Hyperlipidemia Mother   . Cancer Paternal Grandfather        stomach, esophageal  . Heart  attack Maternal Grandmother   . Stroke Paternal Grandmother   . Dementia Paternal Grandmother     Social History Social History   Tobacco Use  . Smoking status: Never Smoker  . Smokeless tobacco: Never Used  Substance Use Topics  . Alcohol use: No  . Drug use: No     Allergies   Patient has no known allergies.   Review of Systems Review of Systems Pertinent negatives listed in HPI   Physical Exam Triage Vital Signs ED Triage Vitals [05/01/20 1510]  Enc Vitals Group     BP (!) 143/87     Pulse Rate 68     Resp      Temp      Temp src      SpO2 98 %      Weight      Height      Head Circumference      Peak Flow      Pain Score 8     Pain Loc      Pain Edu?      Excl. in [place]?    No data found.  Updated Vital Signs BP (!) 143/87 (BP Location: Left Arm)   Pulse 68   SpO2 98%   Visual Acuity Right Eye Distance:   Left Eye Distance:   Bilateral Distance:    Right Eye Near:   Left Eye Near:    Bilateral Near:     Physical Exam HENT:     Head: Normocephalic.     Nose: Nose normal.  Cardiovascular:     Rate and Rhythm: Normal rate and regular rhythm.  Pulmonary:     Effort: Pulmonary effort is normal.     Breath sounds: Normal breath sounds.  Musculoskeletal:       Legs:  Skin:    Capillary Refill: Capillary refill takes less than 2 seconds.  Neurological:     Mental Status: He is alert.  Psychiatric:        Mood and Affect: Mood normal.        Behavior: Behavior normal.        Thought Content: Thought content normal.        Judgment: Judgment normal.      UC Treatments / Results  Labs (all labs ordered are listed, but only abnormal results are displayed) Labs Reviewed - No data to display  EKG   Radiology DG Femur Min 2 Views Left  Result Date: 05/01/2020 CLINICAL DATA:  Pain status post fall from ladder. EXAM: LEFT FEMUR 2 VIEWS COMPARISON:  None. FINDINGS: There are moderate degenerative changes of the left hip. There is no evidence for an acute displaced fracture or dislocation. There appears to be soft tissue swelling about the proximal left femur. IMPRESSION: 1. No acute displaced fracture or dislocation. 2. Moderate degenerative changes of the left hip. Electronically Signed   By: Constance Holster M.D.   On: 05/01/2020 15:43    Procedures Procedures (including critical care time)  Medications Ordered in UC Medications - No data to display  Initial Impression / Assessment and Plan / UC Course  I have reviewed the triage vital signs and the nursing notes.  Pertinent labs & imaging results that  were available during my care of the patient were reviewed by me and considered in my medical decision making (see chart for details).    Patient presents today for evaluation of a left leg injury resulting from a fall.  Patient's imaging is negative of  the left femur.  Patient has significant abrasion of the left thigh wound was cleansed Silvadene applied directly to wound and dressed.  Patient advised to apply Silvadene twice daily over the course of the next 5 days and change dressing twice daily.  Strict ER precautions given if any red flag symptoms develop.  Patient takes Celebrex chronically added cyclobenzaprine for acute management of pain.  Patient verbalized understanding and agreement plan. Final Clinical Impressions(s) / UC Diagnoses   Final diagnoses:  Fall, initial encounter  Abrasion of left thigh, initial encounter     Discharge Instructions     Change dressing twice daily and apply Silvadene cream twice daily for total 5 days.  I have prescribed you cyclobenzaprine take tonight prior to bedtime as you may have pain.  You may take up to twice daily however avoid driving or doing any activity that requires attention as medication can cause severe drowsiness.  You may take the Celebrex along with the cyclobenzaprine without any interaction.     ED Prescriptions    Medication Sig Dispense Auth. Provider   cyclobenzaprine (FLEXERIL) 10 MG tablet Take 1 tablet (10 mg total) by mouth 2 (two) times daily as needed for muscle spasms. 20 tablet Scot Jun, FNP   silver sulfADIAZINE (SILVADENE) 1 % cream Apply 1 application topically 2 (two) times daily for 5 days. Clean site of wound and apply cream. 50 g Scot Jun, FNP     PDMP not reviewed this encounter.   Scot Jun, FNP 05/06/20 [phone]

## 2020-05-29 ENCOUNTER — Telehealth: Payer: No Typology Code available for payment source | Admitting: Orthopedic Surgery

## 2020-05-29 DIAGNOSIS — M7021 Olecranon bursitis, right elbow: Secondary | ICD-10-CM | POA: Diagnosis not present

## 2020-05-29 MED ORDER — SULFAMETHOXAZOLE-TRIMETHOPRIM 800-160 MG PO TABS: 1.0000 | ORAL_TABLET | Freq: Two times a day (BID) | ORAL | 0 refills | Status: AC

## 2020-05-29 NOTE — Progress Notes (Signed)
It appears you have a condition known as olecranon bursitis which is an infection of a fluid-filled sac over your elbow. I have prescribed Septra DS twice daily for 10 days. If it is not significantly better by Monday I would suggest making an appointment to see someone in person as it may need to be aspirated (get a sample of fluid) to see what bacteria is affecting it.  Greater than 5 minutes, yet less than 10 minutes of time have been spent researching, coordinating and implementing care for this patient today.

## 2020-06-03 ENCOUNTER — Other Ambulatory Visit: Payer: Self-pay | Admitting: Physician Assistant

## 2020-06-03 DIAGNOSIS — N529 Male erectile dysfunction, unspecified: Secondary | ICD-10-CM

## 2020-06-03 MED FILL — SERTRALINE HCL 100 MG TABS: 100 | 30 days supply | Qty: 30 | Fill #0

## 2020-06-07 ENCOUNTER — Encounter: Payer: Self-pay | Admitting: Physician Assistant

## 2020-06-07 DIAGNOSIS — M79645 Pain in left finger(s): Secondary | ICD-10-CM

## 2020-06-08 ENCOUNTER — Ambulatory Visit (INDEPENDENT_AMBULATORY_CARE_PROVIDER_SITE_OTHER): Payer: No Typology Code available for payment source | Admitting: Sports Medicine

## 2020-06-08 ENCOUNTER — Other Ambulatory Visit: Payer: Self-pay

## 2020-06-08 ENCOUNTER — Other Ambulatory Visit: Payer: Self-pay | Admitting: Sports Medicine

## 2020-06-08 ENCOUNTER — Encounter: Payer: Self-pay | Admitting: Sports Medicine

## 2020-06-08 DIAGNOSIS — M71121 Other infective bursitis, right elbow: Secondary | ICD-10-CM | POA: Diagnosis not present

## 2020-06-08 DIAGNOSIS — M65332 Trigger finger, left middle finger: Secondary | ICD-10-CM

## 2020-06-08 MED ORDER — DOXYCYCLINE HYCLATE 100 MG PO TABS: 100.0000 mg | ORAL_TABLET | Freq: Two times a day (BID) | ORAL | 0 refills | Status: DC

## 2020-06-08 MED FILL — DOXYCYCLINE HYCLATE 100 MG: 100 | 7 days supply | Qty: 14 | Fill #0

## 2020-06-08 NOTE — Progress Notes (Signed)
    Procedures performed today:    None.  Independent interpretation of notes and tests performed by another provider:   None.  Brief History, Exam, Impression, and Recommendations:    Septic olecranon bursitis, right This pleasant 55 year old male has swelling of his right olecranon bursa, started on Septra appropriately and improved to some degree, still has some swelling and erythema. We will switch to doxycycline and he really needs to add warm compresses. Return to see me in a month for this. He did have some swollen epitrochlear lymph nodes on the right side likely secondary to his infectious process.  Trigger finger, left middle finger Explained the physiology, rehab exercises given. Return to see me in a month, injection if no better.    ___________________________________________ Gwen Her. Dianah Field, M.D., ABFM., CAQSM. Primary Care and [place] Instructor of [employer] [facility]

## 2020-06-08 NOTE — Patient Instructions (Signed)
Trigger Finger  Trigger finger, also called stenosing tenosynovitis,  is a condition that causes a finger to get stuck in a bent position. Each finger has a tendon, which is a tough, cord-like tissue that connects muscle to bone, and each tendon passes through a tunnel of tissue called a tendon sheath. To move your finger, your tendon needs to glide freely through the sheath. Trigger finger happens when the tendon or the sheath thickens, making it difficult to move your finger. Trigger finger can affect any finger or a thumb. It may affect more than one finger. Mild cases may clear up with rest and medicine. Severe cases require more treatment. What are the causes? Trigger finger is caused by a thickened finger tendon or tendon sheath. The cause of this thickening is not known. What increases the risk? The following factors may make you more likely to develop this condition:  Doing activities that require a strong grip.  Having rheumatoid arthritis, gout, or diabetes.  Being 58-108 years old.  Being male. What are the signs or symptoms? Symptoms of this condition include:  Pain when bending or straightening your finger.  Tenderness or swelling where your finger attaches to the palm of your hand.  A lump in the palm of your hand or on the inside of your finger.  Hearing a noise like a pop or a snap when you try to straighten your finger.  Feeling a catching or locking sensation when you try to straighten your finger.  Being unable to straighten your finger. How is this diagnosed? This condition is diagnosed based on your symptoms and a physical exam. How is this treated? This condition may be treated by:  Resting your finger and avoiding activities that make symptoms worse.  Wearing a finger splint to keep your finger extended.  Taking NSAIDs, such as ibuprofen, to relieve pain and swelling.  Doing gentle exercises to stretch the finger as told by your health care  provider.  Having medicine that reduces swelling and inflammation (steroids) injected into the tendon sheath. Injections may need to be repeated.  Having surgery to open the tendon sheath. This may be done if other treatments do not work and you cannot straighten your finger. You may need physical therapy after surgery. Follow these instructions at home: If you have a splint:  Wear the splint as told by your health care provider. Remove it only as told by your health care provider.  Loosen it if your fingers tingle, become numb, or turn cold and blue.  Keep it clean.  If the splint is not waterproof: ? Do not let it get wet. ? Cover it with a watertight covering when you take a bath or shower. Managing pain, stiffness, and swelling If directed, apply heat to the affected area as often as told by your health care provider. Use the heat source that your health care provider recommends, such as a moist heat pack or a heating pad.  Place a towel between your skin and the heat source.  Leave the heat on for 20-30 minutes.  Remove the heat if your skin turns bright red. This is especially important if you are unable to feel pain, heat, or cold. You may have a greater risk of getting burned. If directed, put ice on the painful area. To do this:  If you have a removable splint, remove it as told by your health care provider.  Put ice in a plastic bag.  Place a towel between your skin  and the bag or between your splint and the bag.  Leave the ice on for 20 minutes, 2-3 times a day.      Activity  Rest your finger as told by your health care provider. Avoid activities that make the pain worse.  Return to your normal activities as told by your health care provider. Ask your health care provider what activities are safe for you.  Do exercises as told by your health care provider.  Ask your health care provider when it is safe to drive if you have a splint on your hand. General  instructions  Take over-the-counter and prescription medicines only as told by your health care provider.  Keep all follow-up visits as told by your health care provider. This is important. Contact a health care provider if:  Your symptoms are not improving with home care. Summary  Trigger finger, also called stenosing tenosynovitis, causes your finger to get stuck in a bent position. This can make it difficult and painful to straighten your finger.  This condition develops when a finger tendon or tendon sheath thickens.  Treatment may include resting your finger, wearing a splint, and taking medicines.  In severe cases, surgery to open the tendon sheath may be needed. This information is not intended to replace advice given to you by your health care provider. Make sure you discuss any questions you have with your health care provider. Document Revised: 07/15/2018 Document Reviewed: 07/15/2018 Elsevier Patient Education  2021 [employer].  Cellulitis, Adult  Cellulitis is a skin infection. The infected area is usually warm, red, swollen, and tender. This condition occurs most often in the arms and lower legs. The infection can travel to the muscles, blood, and underlying tissue and become serious. It is very important to get treated for this condition. What are the causes? Cellulitis is caused by bacteria. The bacteria enter through a break in the skin, such as a cut, burn, insect bite, open sore, or crack. What increases the risk? This condition is more likely to occur in people who:  Have a weak body defense system (immune system).  Have open wounds on the skin, such as cuts, burns, bites, and scrapes. Bacteria can enter the body through these open wounds.  Are older than 53 years of age.  Have diabetes.  Have a type of long-lasting (chronic) liver disease (cirrhosis) or kidney disease.  Are obese.  Have a skin condition such as: ? Itchy rash (eczema). ? Slow movement  of blood in the veins (venous stasis). ? Fluid buildup below the skin (edema).  Have had radiation therapy.  Use IV drugs. What are the signs or symptoms? Symptoms of this condition include:  Redness, streaking, or spotting on the skin.  Swollen area of the skin.  Tenderness or pain when an area of the skin is touched.  Warm skin.  A fever.  Chills.  Blisters. How is this diagnosed? This condition is diagnosed based on a medical history and physical exam. You may also have tests, including:  Blood tests.  Imaging tests. How is this treated? Treatment for this condition may include:  Medicines, such as antibiotic medicines or medicines to treat allergies (antihistamines).  Supportive care, such as rest and application of cold or warm cloths (compresses) to the skin.  Hospital care, if the condition is severe. The infection usually starts to get better within 1-2 days of treatment. Follow these instructions at home: Medicines  Take over-the-counter and prescription medicines only as told by  your health care provider.  If you were prescribed an antibiotic medicine, take it as told by your health care provider. Do not stop taking the antibiotic even if you start to feel better. General instructions  Drink enough fluid to keep your urine pale yellow.  Do not touch or rub the infected area.  Raise (elevate) the infected area above the level of your heart while you are sitting or lying down.  Apply warm or cold compresses to the affected area as told by your health care provider.  Keep all follow-up visits as told by your health care provider. This is important. These visits let your health care provider make sure a more serious infection is not developing.   Contact a health care provider if:  You have a fever.  Your symptoms do not begin to improve within 1-2 days of starting treatment.  Your bone or joint underneath the infected area becomes painful after the  skin has healed.  Your infection returns in the same area or another area.  You notice a swollen bump in the infected area.  You develop new symptoms.  You have a general ill feeling (malaise) with muscle aches and pains. Get help right away if:  Your symptoms get worse.  You feel very sleepy.  You develop vomiting or diarrhea that persists.  You notice red streaks coming from the infected area.  Your red area gets larger or turns dark in color. These symptoms may represent a serious problem that is an emergency. Do not wait to see if the symptoms will go away. Get medical help right away. Call your local emergency services (911 in the [place] Do not drive yourself to the hospital. Summary  Cellulitis is a skin infection. This condition occurs most often in the arms and lower legs.  Treatment for this condition may include medicines, such as antibiotic medicines or antihistamines.  Take over-the-counter and prescription medicines only as told by your health care provider. If you were prescribed an antibiotic medicine, do not stop taking the antibiotic even if you start to feel better.  Contact a health care provider if your symptoms do not begin to improve within 1-2 days of starting treatment or your symptoms get worse.  Keep all follow-up visits as told by your health care provider. This is important. These visits let your health care provider make sure that a more serious infection is not developing. This information is not intended to replace advice given to you by your health care provider. Make sure you discuss any questions you have with your health care provider. Document Revised: 03/10/2019 Document Reviewed: 07/19/2017 Elsevier Patient Education  [employer].

## 2020-06-08 NOTE — Assessment & Plan Note (Signed)
This pleasant 55 year old male has swelling of his right olecranon bursa, started on Septra appropriately and improved to some degree, still has some swelling and erythema. We will switch to doxycycline and he really needs to add warm compresses. Return to see me in a month for this. He did have some swollen epitrochlear lymph nodes on the right side likely secondary to his infectious process.

## 2020-06-08 NOTE — Assessment & Plan Note (Signed)
Explained the physiology, rehab exercises given. Return to see me in a month, injection if no better.

## 2020-06-17 ENCOUNTER — Other Ambulatory Visit (HOSPITAL_COMMUNITY): Payer: Self-pay

## 2020-06-17 MED FILL — Clopidogrel Bisulfate Tab 75 MG (Base Equiv): ORAL | 90 days supply | Qty: 90 | Fill #0 | Status: AC

## 2020-06-24 ENCOUNTER — Other Ambulatory Visit (HOSPITAL_COMMUNITY): Payer: Self-pay

## 2020-07-06 ENCOUNTER — Ambulatory Visit: Payer: No Typology Code available for payment source | Admitting: Sports Medicine

## 2020-07-23 ENCOUNTER — Other Ambulatory Visit (HOSPITAL_COMMUNITY): Payer: Self-pay

## 2020-07-23 ENCOUNTER — Other Ambulatory Visit: Payer: Self-pay | Admitting: Physician Assistant

## 2020-07-23 DIAGNOSIS — N529 Male erectile dysfunction, unspecified: Secondary | ICD-10-CM

## 2020-07-23 MED ORDER — SERTRALINE HCL 100 MG PO TABS
ORAL_TABLET | ORAL | 0 refills | Status: DC
  Filled 2020-07-23: qty 15, 15d supply, fill #0

## 2020-07-26 ENCOUNTER — Other Ambulatory Visit: Payer: Self-pay

## 2020-07-26 ENCOUNTER — Encounter: Payer: Self-pay | Admitting: Physician Assistant

## 2020-07-26 ENCOUNTER — Ambulatory Visit (INDEPENDENT_AMBULATORY_CARE_PROVIDER_SITE_OTHER): Payer: No Typology Code available for payment source | Admitting: Physician Assistant

## 2020-07-26 ENCOUNTER — Other Ambulatory Visit (HOSPITAL_COMMUNITY): Payer: Self-pay

## 2020-07-26 VITALS — BP 148/97 | HR 81 | Ht 67.0 in | Wt 198.0 lb

## 2020-07-26 DIAGNOSIS — Z0000 Encounter for general adult medical examination without abnormal findings: Secondary | ICD-10-CM | POA: Diagnosis not present

## 2020-07-26 DIAGNOSIS — E781 Pure hyperglyceridemia: Secondary | ICD-10-CM

## 2020-07-26 DIAGNOSIS — Z125 Encounter for screening for malignant neoplasm of prostate: Secondary | ICD-10-CM

## 2020-07-26 DIAGNOSIS — N529 Male erectile dysfunction, unspecified: Secondary | ICD-10-CM

## 2020-07-26 DIAGNOSIS — R030 Elevated blood-pressure reading, without diagnosis of hypertension: Secondary | ICD-10-CM

## 2020-07-26 DIAGNOSIS — Z1329 Encounter for screening for other suspected endocrine disorder: Secondary | ICD-10-CM | POA: Diagnosis not present

## 2020-07-26 DIAGNOSIS — Z131 Encounter for screening for diabetes mellitus: Secondary | ICD-10-CM

## 2020-07-26 MED ORDER — SERTRALINE HCL 100 MG PO TABS
ORAL_TABLET | ORAL | 0 refills | Status: DC
  Filled 2020-07-26: qty 15, fill #0

## 2020-07-26 MED ORDER — SERTRALINE HCL 100 MG PO TABS
100.0000 mg | ORAL_TABLET | Freq: Every day | ORAL | 3 refills | Status: DC
  Filled 2020-07-26 – 2020-08-31 (×2): qty 90, 90d supply, fill #0
  Filled 2021-01-21: qty 90, 90d supply, fill #1
  Filled 2021-06-21: qty 90, 90d supply, fill #2

## 2020-07-26 NOTE — Patient Instructions (Addendum)
Managing Your Hypertension Hypertension, also called high blood pressure, is when the force of the blood pressing against the walls of the arteries is too strong. Arteries are blood vessels that carry blood from your heart throughout your body. Hypertension forces the heart to work harder to pump blood and may cause the arteries to become narrow or stiff. Understanding blood pressure readings Your personal target blood pressure may vary depending on your medical conditions, your age, and other factors. A blood pressure reading includes a higher number over a lower number. Ideally, your blood pressure should be below 120/80. You should know that:  The first, or top, number is called the systolic pressure. It is a measure of the pressure in your arteries as your heart beats.  The second, or bottom number, is called the diastolic pressure. It is a measure of the pressure in your arteries as the heart relaxes. Blood pressure is classified into four stages. Based on your blood pressure reading, your health care provider may use the following stages to determine what type of treatment you need, if any. Systolic pressure and diastolic pressure are measured in a unit called mmHg. Normal  Systolic pressure: below 892.  Diastolic pressure: below 80. Elevated  Systolic pressure: 119-417.  Diastolic pressure: below 80. Hypertension stage 1  Systolic pressure: 408-144.  Diastolic pressure: 81-85. Hypertension stage 2  Systolic pressure: 631 or above.  Diastolic pressure: 90 or above. How can this condition affect me? Managing your hypertension is an important responsibility. Over time, hypertension can damage the arteries and decrease blood flow to important parts of the body, including the brain, heart, and kidneys. Having untreated or uncontrolled hypertension can lead to:  A heart attack.  A stroke.  A weakened blood vessel (aneurysm).  Heart failure.  Kidney damage.  Eye  damage.  Metabolic syndrome.  Memory and concentration problems.  Vascular dementia. What actions can I take to manage this condition? Hypertension can be managed by making lifestyle changes and possibly by taking medicines. Your health care provider will help you make a plan to bring your blood pressure within a normal range. Nutrition  Eat a diet that is high in fiber and potassium, and low in salt (sodium), added sugar, and fat. An example eating plan is called the Dietary Approaches to Stop Hypertension (DASH) diet. To eat this way: ? Eat plenty of fresh fruits and vegetables. Try to fill one-half of your plate at each meal with fruits and vegetables. ? Eat whole grains, such as whole-wheat pasta, brown rice, or whole-grain bread. Fill about one-fourth of your plate with whole grains. ? Eat low-fat dairy products. ? Avoid fatty cuts of meat, processed or cured meats, and poultry with skin. Fill about one-fourth of your plate with lean proteins such as fish, chicken without skin, beans, eggs, and tofu. ? Avoid pre-made and processed foods. These tend to be higher in sodium, added sugar, and fat.  Reduce your daily sodium intake. Most people with hypertension should eat less than 1,500 mg of sodium a day.   Lifestyle  Work with your health care provider to maintain a healthy body weight or to lose weight. Ask what an ideal weight is for you.  Get at least 30 minutes of exercise that causes your heart to beat faster (aerobic exercise) most days of the week. Activities may include walking, swimming, or biking.  Include exercise to strengthen your muscles (resistance exercise), such as weight lifting, as part of your weekly exercise  routine. Try to do these types of exercises for 30 minutes at least 3 days a week.  Do not use any products that contain nicotine or tobacco, such as cigarettes, e-cigarettes, and chewing tobacco. If you need help quitting, ask your health care  provider.  Control any long-term (chronic) conditions you have, such as high cholesterol or diabetes.  Identify your sources of stress and find ways to manage stress. This may include meditation, deep breathing, or making time for fun activities.   Alcohol use  Do not drink alcohol if: ? Your health care provider tells you not to drink. ? You are pregnant, may be pregnant, or are planning to become pregnant.  If you drink alcohol: ? Limit how much you use to:  0-1 drink a day for women.  0-2 drinks a day for men. ? Be aware of how much alcohol is in your drink. In the [place] one drink equals one 12 oz bottle of beer (355 mL), one 5 oz glass of wine (148 mL), or one 1 oz glass of hard liquor (44 mL). Medicines Your health care provider may prescribe medicine if lifestyle changes are not enough to get your blood pressure under control and if:  Your systolic blood pressure is 130 or higher.  Your diastolic blood pressure is 80 or higher. Take medicines only as told by your health care provider. Follow the directions carefully. Blood pressure medicines must be taken as told by your health care provider. The medicine does not work as well when you skip doses. Skipping doses also puts you at risk for problems. Monitoring Before you monitor your blood pressure:  Do not smoke, drink caffeinated beverages, or exercise within 30 minutes before taking a measurement.  Use the bathroom and empty your bladder (urinate).  Sit quietly for at least 5 minutes before taking measurements. Monitor your blood pressure at home as told by your health care provider. To do this:  Sit with your back straight and supported.  Place your feet flat on the floor. Do not cross your legs.  Support your arm on a flat surface, such as a table. Make sure your upper arm is at heart level.  Each time you measure, take two or three readings one minute apart and record the results. You may also need to have your  blood pressure checked regularly by your health care provider.   General information  Talk with your health care provider about your diet, exercise habits, and other lifestyle factors that may be contributing to hypertension.  Review all the medicines you take with your health care provider because there may be side effects or interactions.  Keep all visits as told by your health care provider. Your health care provider can help you create and adjust your plan for managing your high blood pressure. Where to find more information  National Heart, Lung, and Blood Institute: https://wilson-eaton.com/  American Heart Association: www.heart.org Contact a health care provider if:  You think you are having a reaction to medicines you have taken.  You have repeated (recurrent) headaches.  You feel dizzy.  You have swelling in your ankles.  You have trouble with your vision. Get help right away if:  You develop a severe headache or confusion.  You have unusual weakness or numbness, or you feel faint.  You have severe pain in your chest or abdomen.  You vomit repeatedly.  You have trouble breathing. These symptoms may represent a serious problem that is an emergency. Do  not wait to see if the symptoms will go away. Get medical help right away. Call your local emergency services (911 in the [place] Do not drive yourself to the hospital. Summary  Hypertension is when the force of blood pumping through your arteries is too strong. If this condition is not controlled, it may put you at risk for serious complications.  Your personal target blood pressure may vary depending on your medical conditions, your age, and other factors. For most people, a normal blood pressure is less than 120/80.  Hypertension is managed by lifestyle changes, medicines, or both.  Lifestyle changes to help manage hypertension include losing weight, eating a healthy, low-sodium diet, exercising more, stopping smoking, and  limiting alcohol. This information is not intended to replace advice given to you by your health care provider. Make sure you discuss any questions you have with your health care provider. Document Revised: 04/04/2019 Document Reviewed: 01/28/2019 Elsevier Patient Education  2021 [place] Maintenance, Male Adopting a healthy lifestyle and getting preventive care are important in promoting health and wellness. Ask your health care provider about:  The right schedule for you to have regular tests and exams.  Things you can do on your own to prevent diseases and keep yourself healthy. What should I know about diet, weight, and exercise? Eat a healthy diet  Eat a diet that includes plenty of vegetables, fruits, low-fat dairy products, and lean protein.  Do not eat a lot of foods that are high in solid fats, added sugars, or sodium.   Maintain a healthy weight Body mass index (BMI) is a measurement that can be used to identify possible weight problems. It estimates body fat based on height and weight. Your health care provider can help determine your BMI and help you achieve or maintain a healthy weight. Get regular exercise Get regular exercise. This is one of the most important things you can do for your health. Most adults should:  Exercise for at least 150 minutes each week. The exercise should increase your heart rate and make you sweat (moderate-intensity exercise).  Do strengthening exercises at least twice a week. This is in addition to the moderate-intensity exercise.  Spend less time sitting. Even light physical activity can be beneficial. Watch cholesterol and blood lipids Have your blood tested for lipids and cholesterol at 53 years of age, then have this test every 5 years. You may need to have your cholesterol levels checked more often if:  Your lipid or cholesterol levels are high.  You are older than 53 years of age.  You are at high risk for heart  disease. What should I know about cancer screening? Many types of cancers can be detected early and may often be prevented. Depending on your health history and family history, you may need to have cancer screening at various ages. This may include screening for:  Colorectal cancer.  Prostate cancer.  Skin cancer.  Lung cancer. What should I know about heart disease, diabetes, and high blood pressure? Blood pressure and heart disease  High blood pressure causes heart disease and increases the risk of stroke. This is more likely to develop in people who have high blood pressure readings, are of African descent, or are overweight.  Talk with your health care provider about your target blood pressure readings.  Have your blood pressure checked: ? Every 3-5 years if you are 29-66 years of age. ? Every year if you are 86 years old or older.  If  you are between the ages of 70 and 55 and are a current or former smoker, ask your health care provider if you should have a one-time screening for abdominal aortic aneurysm (AAA). Diabetes Have regular diabetes screenings. This checks your fasting blood sugar level. Have the screening done:  Once every three years after age 40 if you are at a normal weight and have a low risk for diabetes.  More often and at a younger age if you are overweight or have a high risk for diabetes. What should I know about preventing infection? Hepatitis B If you have a higher risk for hepatitis B, you should be screened for this virus. Talk with your health care provider to find out if you are at risk for hepatitis B infection. Hepatitis C Blood testing is recommended for:  Everyone born from [birth year] through 1963.  Anyone with known risk factors for hepatitis C. Sexually transmitted infections (STIs)  You should be screened each year for STIs, including gonorrhea and chlamydia, if: ? You are sexually active and are younger than 53 years of age. ? You are older  than 53 years of age and your health care provider tells you that you are at risk for this type of infection. ? Your sexual activity has changed since you were last screened, and you are at increased risk for chlamydia or gonorrhea. Ask your health care provider if you are at risk.  Ask your health care provider about whether you are at high risk for HIV. Your health care provider may recommend a prescription medicine to help prevent HIV infection. If you choose to take medicine to prevent HIV, you should first get tested for HIV. You should then be tested every 3 months for as long as you are taking the medicine. Follow these instructions at home: Lifestyle  Do not use any products that contain nicotine or tobacco, such as cigarettes, e-cigarettes, and chewing tobacco. If you need help quitting, ask your health care provider.  Do not use street drugs.  Do not share needles.  Ask your health care provider for help if you need support or information about quitting drugs. Alcohol use  Do not drink alcohol if your health care provider tells you not to drink.  If you drink alcohol: ? Limit how much you have to 0-2 drinks a day. ? Be aware of how much alcohol is in your drink. In the [place] one drink equals one 12 oz bottle of beer (355 mL), one 5 oz glass of wine (148 mL), or one 1 oz glass of hard liquor (44 mL). General instructions  Schedule regular health, dental, and eye exams.  Stay current with your vaccines.  Tell your health care provider if: ? You often feel depressed. ? You have ever been abused or do not feel safe at home. Summary  Adopting a healthy lifestyle and getting preventive care are important in promoting health and wellness.  Follow your health care provider's instructions about healthy diet, exercising, and getting tested or screened for diseases.  Follow your health care provider's instructions on monitoring your cholesterol and blood pressure. This  information is not intended to replace advice given to you by your health care provider. Make sure you discuss any questions you have with your health care provider. Document Revised: 02/20/2018 Document Reviewed: 02/20/2018 Elsevier Patient Education  2021 [place] Maintenance, Male Adopting a healthy lifestyle and getting preventive care are important in promoting health and wellness. Ask your health  care provider about:  The right schedule for you to have regular tests and exams.  Things you can do on your own to prevent diseases and keep yourself healthy. What should I know about diet, weight, and exercise? Eat a healthy diet  Eat a diet that includes plenty of vegetables, fruits, low-fat dairy products, and lean protein.  Do not eat a lot of foods that are high in solid fats, added sugars, or sodium.   Maintain a healthy weight Body mass index (BMI) is a measurement that can be used to identify possible weight problems. It estimates body fat based on height and weight. Your health care provider can help determine your BMI and help you achieve or maintain a healthy weight. Get regular exercise Get regular exercise. This is one of the most important things you can do for your health. Most adults should:  Exercise for at least 150 minutes each week. The exercise should increase your heart rate and make you sweat (moderate-intensity exercise).  Do strengthening exercises at least twice a week. This is in addition to the moderate-intensity exercise.  Spend less time sitting. Even light physical activity can be beneficial. Watch cholesterol and blood lipids Have your blood tested for lipids and cholesterol at 55 years of age, then have this test every 5 years. You may need to have your cholesterol levels checked more often if:  Your lipid or cholesterol levels are high.  You are older than 53 years of age.  You are at high risk for heart disease. What should I know  about cancer screening? Many types of cancers can be detected early and may often be prevented. Depending on your health history and family history, you may need to have cancer screening at various ages. This may include screening for:  Colorectal cancer.  Prostate cancer.  Skin cancer.  Lung cancer. What should I know about heart disease, diabetes, and high blood pressure? Blood pressure and heart disease  High blood pressure causes heart disease and increases the risk of stroke. This is more likely to develop in people who have high blood pressure readings, are of African descent, or are overweight.  Talk with your health care provider about your target blood pressure readings.  Have your blood pressure checked: ? Every 3-5 years if you are 26-5 years of age. ? Every year if you are 64 years old or older.  If you are between the ages of 38 and 68 and are a current or former smoker, ask your health care provider if you should have a one-time screening for abdominal aortic aneurysm (AAA). Diabetes Have regular diabetes screenings. This checks your fasting blood sugar level. Have the screening done:  Once every three years after age 18 if you are at a normal weight and have a low risk for diabetes.  More often and at a younger age if you are overweight or have a high risk for diabetes. What should I know about preventing infection? Hepatitis B If you have a higher risk for hepatitis B, you should be screened for this virus. Talk with your health care provider to find out if you are at risk for hepatitis B infection. Hepatitis C Blood testing is recommended for:  Everyone born from [birth year] through 1963.  Anyone with known risk factors for hepatitis C. Sexually transmitted infections (STIs)  You should be screened each year for STIs, including gonorrhea and chlamydia, if: ? You are sexually active and are younger than 53 years of age. ?  You are older than 53 years of age and your  health care provider tells you that you are at risk for this type of infection. ? Your sexual activity has changed since you were last screened, and you are at increased risk for chlamydia or gonorrhea. Ask your health care provider if you are at risk.  Ask your health care provider about whether you are at high risk for HIV. Your health care provider may recommend a prescription medicine to help prevent HIV infection. If you choose to take medicine to prevent HIV, you should first get tested for HIV. You should then be tested every 3 months for as long as you are taking the medicine. Follow these instructions at home: Lifestyle  Do not use any products that contain nicotine or tobacco, such as cigarettes, e-cigarettes, and chewing tobacco. If you need help quitting, ask your health care provider.  Do not use street drugs.  Do not share needles.  Ask your health care provider for help if you need support or information about quitting drugs. Alcohol use  Do not drink alcohol if your health care provider tells you not to drink.  If you drink alcohol: ? Limit how much you have to 0-2 drinks a day. ? Be aware of how much alcohol is in your drink. In the [place] one drink equals one 12 oz bottle of beer (355 mL), one 5 oz glass of wine (148 mL), or one 1 oz glass of hard liquor (44 mL). General instructions  Schedule regular health, dental, and eye exams.  Stay current with your vaccines.  Tell your health care provider if: ? You often feel depressed. ? You have ever been abused or do not feel safe at home. Summary  Adopting a healthy lifestyle and getting preventive care are important in promoting health and wellness.  Follow your health care provider's instructions about healthy diet, exercising, and getting tested or screened for diseases.  Follow your health care provider's instructions on monitoring your cholesterol and blood pressure. This information is not intended to replace  advice given to you by your health care provider. Make sure you discuss any questions you have with your health care provider. Document Revised: 02/20/2018 Document Reviewed: 02/20/2018 Elsevier Patient Education  2021 [employer].

## 2020-07-26 NOTE — Progress Notes (Addendum)
Subjective:    Patient ID: Zachary Massey, male    DOB: 1965-08-29, 55 y.o.   MRN: [id number]  55 year old male presents for annual physical. He has been doing well overall and has no active complaints. Sees dentist and is planning to see eye doctor soon.  Gastric reflux is under control with omeprazole daily. He is still having chronic pain in wrist and knees, no recent injections. He continues to take celebrex. He drinks up to 3 sodas a day, but is going to try to reduce his soda intake. He claims his diet could be better, but he tends to go for the fast food options. He is veery active at work, but does not exercise outside of work.   Patient Care Team    Relationship Specialty Notifications Start End  Donella Stade, [place] PCP - General Family Medicine  09/30/18   Garvin Fila, MD Consulting Physician Neurology  06/16/16   Ronald Lobo, MD Consulting Physician Gastroenterology  06/16/16   Kathie Rhodes, MD (Inactive) Consulting Physician Urology  06/16/16     Patient Active Problem List   Diagnosis Date Noted  . Septic olecranon bursitis, right 06/08/2020  . Trigger finger, left middle finger 06/08/2020  . Colon polyp 01/07/2020  . 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  . Elevated blood pressure reading 12/12/2019  . Chronic pain of right knee 12/12/2019  . Left elbow pain 12/12/2019  . Posttraumatic osteoarthritis of right third PIP 05/16/2019  . History of stroke 10/01/2018  . Hypertriglyceridemia 09/30/2018  . 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 Oral pain 07/17/2018  . Screening for colon cancer 08/15/2017  . Vitamin D deficiency 06/23/2016  . Hyperlipidemia 06/23/2016  . Screening for diabetes mellitus 06/23/2016  . Fatigue 06/23/2016  . Health care maintenance 06/23/2016  . Ganglion cyst of wrist, left 02/25/2016  . Memory loss 02/03/2015  . Erectile dysfunction 07/13/2013  . Hypogonadism male 07/13/2013  .  Generalized anxiety disorder 07/13/2013  . Premature ejaculation 07/13/2013  . Ichthyosis 04/09/2012  . GERD (gastroesophageal reflux disease) 12/03/2011  . History of anemia 12/01/2011  . History of TIA (transient ischemic attack) 12/01/2011     Past Medical History:  Diagnosis Date  . Anxiety   . Depression   . Erectile dysfunction   . GERD (gastroesophageal reflux disease)   . GERD (gastroesophageal reflux disease)   . Hypogonadism male   . Premature ejaculation   . Stroke ([place]) 03/13/2006   TIA (aphasia; admitted; Plavix therapy; maintained on Plavix.  Marland Kitchen TIA (transient ischemic attack)      Past Surgical History:  Procedure Laterality Date  . VASECTOMY       Family History  Problem Relation Age of Onset  . Hyperlipidemia Mother   . Cancer Paternal Grandfather        stomach, esophageal  . Heart attack Maternal Grandmother   . Stroke Paternal Grandmother   . Dementia Paternal Grandmother      Social History   Substance and Sexual Activity  Drug Use No     Social History   Substance and Sexual Activity  Alcohol Use No     Social History   Tobacco Use  Smoking Status Never Smoker  Smokeless Tobacco Never Used     Outpatient Encounter Medications as of 07/26/2020  Medication Sig  . celecoxib (CELEBREX) 200 MG capsule TAKE 1 CAPSULE BY MOUTH TWICE DAILY  . clopidogrel (PLAVIX) 75 MG tablet TAKE 1 TABLET  BY MOUTH DAILY.  . cyclobenzaprine (FLEXERIL) 10 MG tablet Take 1 tablet (10 mg total) by mouth 2 (two) times daily as needed for muscle spasms.  Earney Navy Bicarbonate (ZEGERID OTC PO) Take 2 tablets by mouth daily.  . sildenafil (VIAGRA) 50 MG tablet Take 1 tablet (50 mg total) by mouth as needed for erectile dysfunction.  . [DISCONTINUED] sertraline (ZOLOFT) 100 MG tablet TAKE 1 TABLET BY MOUTH DAILY **PATIENT NEEDS TO MAKE AN APPOINTMENT.  Marland Kitchen sertraline (ZOLOFT) 100 MG tablet Take 1 tablet (100 mg total) by mouth daily.  . [DISCONTINUED]  doxycycline (VIBRA-TABS) 100 MG tablet TAKE 1 TABLET BY MOUTH 2 TIMES DAILY FOR 7 DAYS  . [DISCONTINUED] icosapent Ethyl (VASCEPA) 1 g capsule Take 2 capsules (2 g total) by mouth 2 (two) times daily.  . [DISCONTINUED] sertraline (ZOLOFT) 100 MG tablet TAKE 1 TABLET BY MOUTH DAILY **PATIENT NEEDS TO MAKE AN APPOINTMENT.   No facility-administered encounter medications on file as of 07/26/2020.    Allergies: Patient has no known allergies.  Body mass index is 31.01 kg/m.  Blood pressure (!) 148/97, pulse 81, height 5\' 7"  (1.702 m), weight 89.8 kg, SpO2 97 %.  Review of Systems  Constitutional: Negative for activity change, fatigue and unexpected weight change.  HENT: Negative for hearing loss and tinnitus.   Eyes: Negative for visual disturbance.  Respiratory: Negative for cough and chest tightness.   Cardiovascular: Negative for chest pain and palpitations.  Gastrointestinal: Negative for abdominal pain, constipation and diarrhea.  Genitourinary: Negative for difficulty urinating, dysuria and frequency.  Musculoskeletal: Positive for arthralgias. Negative for myalgias.  Skin: Negative.   Allergic/Immunologic: Negative for environmental allergies.  Neurological: Negative for headaches.       Objective:   Physical Exam Constitutional:      General: He is not in acute distress.    Appearance: Normal appearance. He is normal weight. He is not ill-appearing, toxic-appearing or diaphoretic.  HENT:     Head: Normocephalic and atraumatic.     Right Ear: Tympanic membrane, ear canal and external ear normal. There is no impacted cerumen.     Left Ear: Ear canal and external ear normal. There is no impacted cerumen.     Nose: Nose normal. No congestion.     Mouth/Throat:     Mouth: Mucous membranes are moist.     Pharynx: No oropharyngeal exudate.  Eyes:     Extraocular Movements: Extraocular movements intact.     Conjunctiva/sclera: Conjunctivae normal.     Pupils: Pupils are  equal, round, and reactive to light.  Cardiovascular:     Rate and Rhythm: Normal rate.     Pulses: Normal pulses.     Heart sounds: Normal heart sounds. No murmur heard. No friction rub. No gallop.   Pulmonary:     Effort: Pulmonary effort is normal. No respiratory distress.     Breath sounds: Normal breath sounds. No stridor. No wheezing, rhonchi or rales.  Chest:     Chest wall: No tenderness.  Abdominal:     General: Abdomen is flat. Bowel sounds are normal. There is no distension.     Palpations: There is no mass.     Tenderness: There is no abdominal tenderness. There is no guarding or rebound.     Hernia: No hernia is present.  Musculoskeletal:        General: No tenderness. Normal range of motion.     Cervical back: Normal range of motion. No muscular tenderness.  Lymphadenopathy:  Cervical: No cervical adenopathy.  Skin:    General: Skin is warm and dry.     Capillary Refill: Capillary refill takes less than 2 seconds.  Neurological:     General: No focal deficit present.     Mental Status: He is alert and oriented to person, place, and time.  Psychiatric:        Mood and Affect: Mood normal.        Behavior: Behavior normal.        Thought Content: Thought content normal.        Judgment: Judgment normal.   .. Depression screen [facility] 2/9 07/26/2020 03/12/2019 07/17/2018 12/28/2016 04/07/2015  Decreased Interest 0 0 0 0 0  Down, Depressed, Hopeless 0 0 1 0 0  PHQ - 2 Score 0 0 1 0 0  Altered sleeping 0 - 1 - -  Tired, decreased energy 0 - 1 - -  Change in appetite 0 - 0 - -  Feeling bad or failure about yourself  0 - 1 - -  Trouble concentrating 0 - 0 - -  Moving slowly or fidgety/restless 0 - 0 - -  Suicidal thoughts 0 - 0 - -  PHQ-9 Score 0 - 4 - -  Difficult doing work/chores Not difficult at all - Not difficult at all - -       Assessment & Plan:   1. Health care maintenance   2. Hypertriglyceridemia   3. Screening for diabetes mellitus   4. Thyroid  disorder screen   5. Screening PSA (prostate specific antigen)   6. Erectile dysfunction, unspecified erectile dysfunction type     No problem-specific Assessment & Plan notes found for this encounter.  Marland Kitchen.Start a regular exercise program and make sure you are eating a healthy diet Try to eat 4 servings of dairy a day or take a calcium supplement (500mg  twice a day).  Patient should take blood pressure at home for accurate reading. His BP was elevated today, but the patient says he was under significant stress at work this morning.  Patient had colonoscopy 2 years ago. Patient refused Shingrix vaccine. Fasting labs needed- CMP, TSH, PSA, CBC and lipids  Zoloft refilled- mood has been stable    FOLLOW-UP:  Return in about 6 months (around 01/26/2021). BP recheck.

## 2020-08-03 ENCOUNTER — Ambulatory Visit: Payer: No Typology Code available for payment source

## 2020-08-03 NOTE — Progress Notes (Signed)
   Covid-19 Vaccination Clinic  Name:  URAL ACREE    MRN: [id number] DOB: 1965-08-25  08/03/2020  Mr. Grammatico was observed post Covid-19 immunization for 15 minutes without incident. He was provided with Vaccine Information Sheet and instruction to access the V-Safe system.   Mr. Beranek was instructed to call 911 with any severe reactions post vaccine: Marland Kitchen Difficulty breathing  . Swelling of face and throat  . A fast heartbeat  . A bad rash all over body  . Dizziness and weakness

## 2020-08-10 ENCOUNTER — Other Ambulatory Visit (HOSPITAL_BASED_OUTPATIENT_CLINIC_OR_DEPARTMENT_OTHER): Payer: Self-pay

## 2020-08-10 MED ORDER — PFIZER-BIONT COVID-19 VAC-TRIS 30 MCG/0.3ML IM SUSP
INTRAMUSCULAR | 0 refills | Status: DC
  Filled 2020-08-10: qty 0.3, 1d supply, fill #0

## 2020-08-31 ENCOUNTER — Other Ambulatory Visit (HOSPITAL_COMMUNITY): Payer: Self-pay

## 2020-11-18 ENCOUNTER — Other Ambulatory Visit (HOSPITAL_COMMUNITY): Payer: Self-pay

## 2020-11-18 MED FILL — Clopidogrel Bisulfate Tab 75 MG (Base Equiv): ORAL | 90 days supply | Qty: 90 | Fill #1 | Status: AC

## 2020-12-06 ENCOUNTER — Other Ambulatory Visit (HOSPITAL_COMMUNITY): Payer: Self-pay

## 2020-12-06 ENCOUNTER — Encounter: Payer: Self-pay | Admitting: Physician Assistant

## 2020-12-06 ENCOUNTER — Other Ambulatory Visit: Payer: Self-pay

## 2020-12-06 ENCOUNTER — Ambulatory Visit (INDEPENDENT_AMBULATORY_CARE_PROVIDER_SITE_OTHER): Payer: No Typology Code available for payment source | Admitting: Physician Assistant

## 2020-12-06 VITALS — BP 136/86 | HR 64 | Ht 67.0 in | Wt 201.0 lb

## 2020-12-06 DIAGNOSIS — N529 Male erectile dysfunction, unspecified: Secondary | ICD-10-CM

## 2020-12-06 DIAGNOSIS — R030 Elevated blood-pressure reading, without diagnosis of hypertension: Secondary | ICD-10-CM | POA: Diagnosis not present

## 2020-12-06 DIAGNOSIS — Z23 Encounter for immunization: Secondary | ICD-10-CM

## 2020-12-06 DIAGNOSIS — Z8673 Personal history of transient ischemic attack (TIA), and cerebral infarction without residual deficits: Secondary | ICD-10-CM

## 2020-12-06 DIAGNOSIS — R7989 Other specified abnormal findings of blood chemistry: Secondary | ICD-10-CM

## 2020-12-06 MED ORDER — TADALAFIL 20 MG PO TABS
20.0000 mg | ORAL_TABLET | Freq: Every day | ORAL | 1 refills | Status: DC | PRN
  Filled 2020-12-06: qty 6, 30d supply, fill #0

## 2020-12-06 MED ORDER — ROSUVASTATIN CALCIUM 10 MG PO TABS
10.0000 mg | ORAL_TABLET | Freq: Every day | ORAL | 1 refills | Status: DC
Start: 2020-12-06 — End: 2021-08-17
  Filled 2020-12-06: qty 90, 90d supply, fill #0
  Filled 2021-05-06 (×2): qty 90, 90d supply, fill #1

## 2020-12-06 NOTE — Patient Instructions (Signed)

## 2020-12-06 NOTE — Progress Notes (Signed)
Subjective:    Patient ID: Zachary Massey, male    DOB: Jun 09, 1965, 55 y.o.   MRN: [id number]  HPI  Patient is a 55 y/o male with a PMH of a stroke presents to the clinic with a chief complaint of "erectile dysfunction". Patient states he has had ED for 5 years but it has been progressively getting worse. Patient has been taking Viagra 50 mg but he states it is not helping. Patient stated last year after starting Atorvastatin 20mg  it worsened his ED so he discontinued on 11/2019. Patient states his testosterone was checked previously and was low in 2013. He was given testosterone injections but stopped in 2014 because he felt like they weren't helping with his ED. Patient would like to re-check his testosterone. Patient denies fatigue, urinary urgency, frequency, or retention.   .. Active Ambulatory Problems    Diagnosis Date Noted   History of anemia 12/01/2011   History of TIA (transient ischemic attack) 12/01/2011   GERD (gastroesophageal reflux disease) 12/03/2011   Ichthyosis 04/09/2012   Erectile dysfunction 07/13/2013   Hypogonadism male 07/13/2013   Generalized anxiety disorder 07/13/2013   Premature ejaculation 07/13/2013   Memory loss 02/03/2015   Ganglion cyst of wrist, left 02/25/2016   Vitamin D deficiency 06/23/2016   Hyperlipidemia 06/23/2016   Screening for diabetes mellitus 06/23/2016   Fatigue 06/23/2016   Health care maintenance 06/23/2016   Screening for colon cancer 08/15/2017   Oral pain 07/17/2018   Encounter for examination required by Department of Transportation (DOT) 09/04/2018   Hypertriglyceridemia 09/30/2018   History of stroke 10/01/2018   Posttraumatic osteoarthritis of right third PIP 05/16/2019   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   Elevated blood pressure reading 12/12/2019   Chronic pain of right knee 12/12/2019   Left elbow pain 12/12/2019   Colon polyp 01/07/2020   Septic olecranon bursitis, right 06/08/2020   Trigger finger, left middle  finger 06/08/2020   Low testosterone 12/06/2020   Resolved Ambulatory Problems    Diagnosis Date Noted   MCI (mild cognitive impairment) 02/03/2015   Bronchitis, acute 12/28/2016   Past Medical History:  Diagnosis Date   Anxiety    Depression    Stroke ([place]) 03/13/2006   TIA (transient ischemic attack)      Review of Systems  All other systems reviewed and are negative.     Objective:   Physical Exam Vitals reviewed.  Constitutional:      Appearance: Normal appearance. He is obese.  HENT:     Head: Normocephalic.  Neck:     Vascular: No carotid bruit.  Cardiovascular:     Rate and Rhythm: Normal rate and regular rhythm.     Pulses: Normal pulses.     Heart sounds: Normal heart sounds.  Pulmonary:     Effort: Pulmonary effort is normal.     Breath sounds: Normal breath sounds.  Neurological:     General: No focal deficit present.     Mental Status: He is alert.  Psychiatric:        Mood and Affect: Mood normal.   ..  [place] Name 12/06/20 1500         During the last Month   Sensation of Bladder not Empty Not at all     Urinate<2 hours after last Not at all     Mult. stop/start when voiding Not at all     Difficult to postpone voiding Not at all  Weak urinary stream Not at all     Push/strain to begin urination Not at all     Times per night up to urinate Less than 1 time in 5           OTHER   Total Score 1             ..  Androgen Deficiency in the Aging Male     [place] Name 12/06/20 1600         Androgen Deficiency in the Aging Male   Do you have a decrease in libido (sex drive) Yes     Do you have lack of energy Yes     Do you have a decrease in strength and/or endurance Yes     Have you lost height Yes     Have you noticed a decreased "enjoyment of life" No     Are you sad and/or grumpy Yes     Are your erections less strong Yes     Have you noticed a recent deterioration in your ability to play sports No     Are you  falling asleep after dinner Yes     Has there been a recent deterioration in your work performance No             ..  [place] Name 12/06/20 1600         SHIM: Over the last 6 months:   How do you rate your confidence that you could get and keep an erection? Very Low     When you had erections with sexual stimulation, how often were your erections hard enough for penetration (entering your partner)? Almost Never or Never     During sexual intercourse, how often were you able to maintain your erection after you had penetrated (entered) your partner? Almost Never or Never     During sexual intercourse, how difficult was it to maintain your erection to completion of intercourse? Extremely Difficult     When you attempted sexual intercourse, how often was it satisfactory for you? Almost Never or Never           SHIM Total Score   SHIM 5             ..        Assessment & Plan:  Marland KitchenMarland KitchenHoward was seen today for follow-up.  Diagnoses and all orders for this visit:  Erectile dysfunction, unspecified erectile dysfunction type -     tadalafil (CIALIS) 20 MG tablet; Take 1 tablet (20 mg total) by mouth daily as needed for erectile dysfunction.  Need for shingles vaccine -     Varicella-zoster vaccine IM  Flu vaccine need -     Flu Vaccine QUAD 68mo+IM (Fluarix, Fluzone & Alfiuria Quad PF)  Low testosterone -     Testosterone Total,Free,Bio, Males  Elevated blood pressure reading  H/O TIA (transient ischemic attack) and stroke -     rosuvastatin (CRESTOR) 10 MG tablet; Take 1 tablet (10 mg total) by mouth daily.  Added testosterone to his fasting labs.  AuA is normal.  SHIM and ADAM both positive for dysfunction.  Explained testosterone will not help with ED.  BP elevated. Discussed need to start monitoring.  Needs to start working on weight loss and low salt diet.   Stop viagra. Start Cialis.   Discussed importance of taking statin with his history. Start  crestor.   Follow up in 3 months.   Marland KitchenMarland Kitchen  I, Iran Planas PA-C, have reviewed and agree with the above documentation in it's entirety.

## 2020-12-07 ENCOUNTER — Encounter: Payer: Self-pay | Admitting: Physician Assistant

## 2020-12-08 ENCOUNTER — Other Ambulatory Visit (HOSPITAL_COMMUNITY): Payer: Self-pay

## 2020-12-08 ENCOUNTER — Other Ambulatory Visit: Payer: Self-pay | Admitting: *Deleted

## 2020-12-08 DIAGNOSIS — G8929 Other chronic pain: Secondary | ICD-10-CM

## 2020-12-08 MED ORDER — CELECOXIB 200 MG PO CAPS
ORAL_CAPSULE | Freq: Two times a day (BID) | ORAL | 1 refills | Status: DC
  Filled 2020-12-08: qty 180, 90d supply, fill #0
  Filled 2021-05-06: qty 180, 90d supply, fill #1

## 2020-12-22 ENCOUNTER — Other Ambulatory Visit: Payer: Self-pay

## 2020-12-22 DIAGNOSIS — R7989 Other specified abnormal findings of blood chemistry: Secondary | ICD-10-CM

## 2020-12-22 LAB — TESTOSTERONE TOTAL,FREE,BIO, MALES
Albumin: 4.5 g/dL (ref 3.6–5.1)
Sex Hormone Binding: 14 nmol/L (ref 10–50)
Testosterone: 143 ng/dL — ABNORMAL LOW (ref 250–827)

## 2020-12-22 NOTE — Progress Notes (Signed)
Testosterone ordered per labs.

## 2020-12-22 NOTE — Progress Notes (Signed)
Testosterone is low. Insurance usually requires 2 lows before coverage. Recheck in 2 weeks. Would you like to consider injections or topicals to replace?

## 2020-12-22 NOTE — Progress Notes (Signed)
Magnus,   Thyroid looks good.  PSA looks great.  Kidney, liver look good.  Fasting sugars elevated. Add A1C please.  Your TG are very elevated. You need to be on the crestor and recheck in 6 months.

## 2020-12-23 LAB — COMPLETE METABOLIC PANEL WITH GFR
AG Ratio: 1.8 (calc) (ref 1.0–2.5)
ALT: 14 U/L (ref 9–46)
AST: 18 U/L (ref 10–35)
Albumin: 4.5 g/dL (ref 3.6–5.1)
Alkaline phosphatase (APISO): 90 U/L (ref 35–144)
BUN: 14 mg/dL (ref 7–25)
CO2: 28 mmol/L (ref 20–32)
Calcium: 9.3 mg/dL (ref 8.6–10.3)
Chloride: 103 mmol/L (ref 98–110)
Creat: 0.9 mg/dL (ref 0.70–1.30)
Globulin: 2.5 g/dL (calc) (ref 1.9–3.7)
Glucose, Bld: 101 mg/dL — ABNORMAL HIGH (ref 65–99)
Potassium: 4.5 mmol/L (ref 3.5–5.3)
Sodium: 137 mmol/L (ref 135–146)
Total Bilirubin: 0.6 mg/dL (ref 0.2–1.2)
Total Protein: 7 g/dL (ref 6.1–8.1)
eGFR: 103 mL/min/{1.73_m2} (ref >=60)

## 2020-12-23 LAB — CBC WITH DIFFERENTIAL/PLATELET
Absolute Monocytes: 461 cells/uL (ref 200–950)
Basophils Absolute: 72 cells/uL (ref 0–200)
Basophils Relative: 1 %
Eosinophils Absolute: 310 cells/uL (ref 15–500)
Eosinophils Relative: 4.3 %
HCT: 43.3 % (ref 38.5–50.0)
Hemoglobin: 14.2 g/dL (ref 13.2–17.1)
Lymphs Abs: 2722 cells/uL (ref 850–3900)
MCH: 24.7 pg — ABNORMAL LOW (ref 27.0–33.0)
MCHC: 32.8 g/dL (ref 32.0–36.0)
MCV: 75.3 fL — ABNORMAL LOW (ref 80.0–100.0)
MPV: 10.1 fL (ref 7.5–12.5)
Monocytes Relative: 6.4 %
Neutro Abs: 3636 cells/uL (ref 1500–7800)
Neutrophils Relative %: 50.5 %
Platelets: 273 10*3/uL (ref 140–400)
RBC: 5.75 10*6/uL (ref 4.20–5.80)
RDW: 14.8 % (ref 11.0–15.0)
Total Lymphocyte: 37.8 %
WBC: 7.2 10*3/uL (ref 3.8–10.8)

## 2020-12-23 LAB — LIPID PANEL W/REFLEX DIRECT LDL
Cholesterol: 182 mg/dL (ref <200)
HDL: 34 mg/dL — ABNORMAL LOW (ref >=40)
Non-HDL Cholesterol (Calc): 148 mg/dL (calc) — ABNORMAL HIGH (ref <130)
Total CHOL/HDL Ratio: 5.4 (calc) — ABNORMAL HIGH (ref <5.0)
Triglycerides: 459 mg/dL — ABNORMAL HIGH (ref <150)

## 2020-12-23 LAB — DIRECT LDL: Direct LDL: 59 mg/dL (ref <100)

## 2020-12-23 LAB — PSA: PSA: 0.65 ng/mL (ref <=4.00)

## 2020-12-23 LAB — TSH: TSH: 3.6 mIU/L (ref 0.40–4.50)

## 2020-12-23 LAB — TEST AUTHORIZATION

## 2020-12-23 LAB — HEMOGLOBIN A1C W/OUT EAG: Hgb A1c MFr Bld: 5.4 % of total Hgb (ref <5.7)

## 2020-12-27 NOTE — Progress Notes (Signed)
Direct LDL actually looks great. How are you tolerating the new statin, crestor?  A1C is normal. Better than 2 years ago. No diabetes.

## 2021-01-07 LAB — TESTOSTERONE: Testosterone: 102 ng/dL — ABNORMAL LOW (ref 250–827)

## 2021-01-10 NOTE — Progress Notes (Signed)
Confirmed 2 low testosterones. Would you like to replace with testosterone shots and tonya given them to you or try topical?

## 2021-01-11 ENCOUNTER — Other Ambulatory Visit: Payer: Self-pay | Admitting: Neurology

## 2021-01-11 ENCOUNTER — Other Ambulatory Visit (HOSPITAL_COMMUNITY): Payer: Self-pay

## 2021-01-11 MED ORDER — "BD LUER-LOK SYRINGE 22G X 1-1/2"" 3 ML MISC"
1 refills | Status: DC
  Filled 2021-01-11: qty 10, 84d supply, fill #0

## 2021-01-11 MED ORDER — TESTOSTERONE CYPIONATE 200 MG/ML IM SOLN
200.0000 mg | INTRAMUSCULAR | 1 refills | Status: DC
  Filled 2021-01-11: qty 6, 84d supply, fill #0

## 2021-01-11 MED ORDER — "NEEDLE (DISP) 18G X 1-1/2"" MISC"
1 refills | Status: AC
  Filled 2021-01-11: qty 10, 84d supply, fill #0

## 2021-01-11 NOTE — Telephone Encounter (Signed)
Testosterone order pended. Please sign.

## 2021-01-12 ENCOUNTER — Other Ambulatory Visit (HOSPITAL_COMMUNITY): Payer: Self-pay

## 2021-01-13 ENCOUNTER — Other Ambulatory Visit: Payer: Self-pay

## 2021-01-13 ENCOUNTER — Telehealth: Payer: Self-pay

## 2021-01-13 DIAGNOSIS — N529 Male erectile dysfunction, unspecified: Secondary | ICD-10-CM

## 2021-01-13 MED ORDER — TADALAFIL 20 MG PO TABS: 20.0000 mg | ORAL_TABLET | Freq: Every day | ORAL | 1 refills | Status: DC | PRN

## 2021-01-13 NOTE — Telephone Encounter (Signed)
Medication: testosterone cypionate (DEPOTESTOSTERONE CYPIONATE) 200 MG/ML injection Prior authorization submitted via CoverMyMeds on 01/13/2021 PA submission pending

## 2021-01-19 ENCOUNTER — Other Ambulatory Visit (HOSPITAL_COMMUNITY): Payer: Self-pay

## 2021-01-21 ENCOUNTER — Other Ambulatory Visit (HOSPITAL_COMMUNITY): Payer: Self-pay

## 2021-02-10 NOTE — Telephone Encounter (Signed)
Medication:  testosterone cypionate (DEPOTESTOSTERONE CYPIONATE) 200 MG/ML injection Prior authorization determination received Medication has been approved Approval dates: 01/17/2021-01/16/2022  Patient aware via: [place] aware: Yes Provider aware via this encounter

## 2021-04-01 ENCOUNTER — Other Ambulatory Visit (HOSPITAL_COMMUNITY): Payer: Self-pay

## 2021-04-01 ENCOUNTER — Other Ambulatory Visit: Payer: Self-pay | Admitting: Physician Assistant

## 2021-04-01 DIAGNOSIS — Z8673 Personal history of transient ischemic attack (TIA), and cerebral infarction without residual deficits: Secondary | ICD-10-CM

## 2021-04-02 ENCOUNTER — Other Ambulatory Visit (HOSPITAL_COMMUNITY): Payer: Self-pay

## 2021-04-08 IMAGING — DX DG FEMUR 2+V*L*
4 series · 4 of 4 positions shown · non-contrast
Comparison: None.

CLINICAL DATA: Pain status post fall from ladder.

EXAM:
LEFT FEMUR 2 VIEWS

[femur ap (1 of 2)]
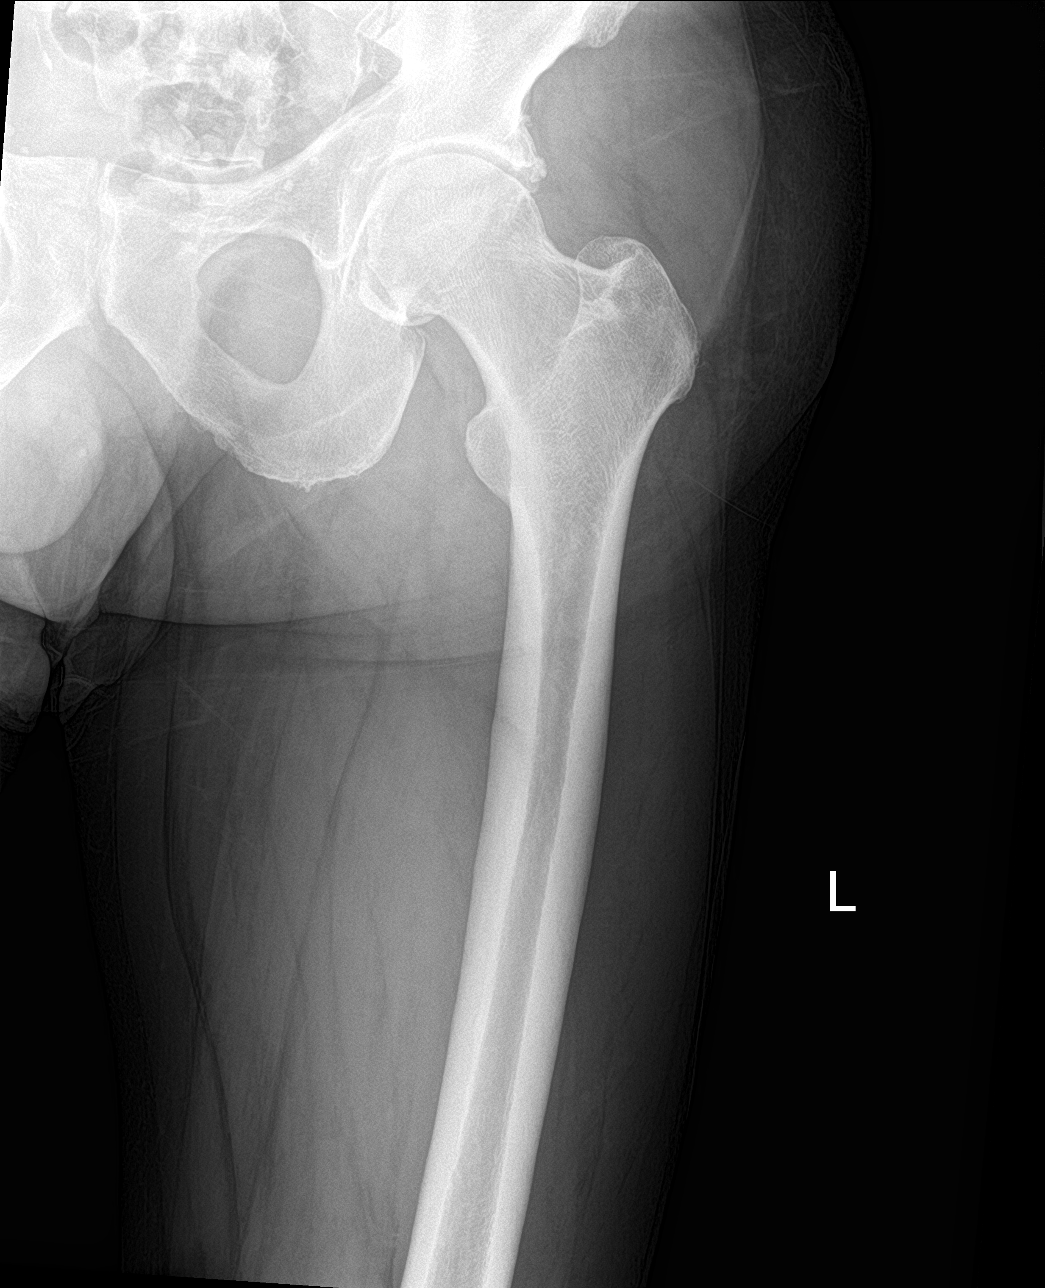

[femur ap (2 of 2)]
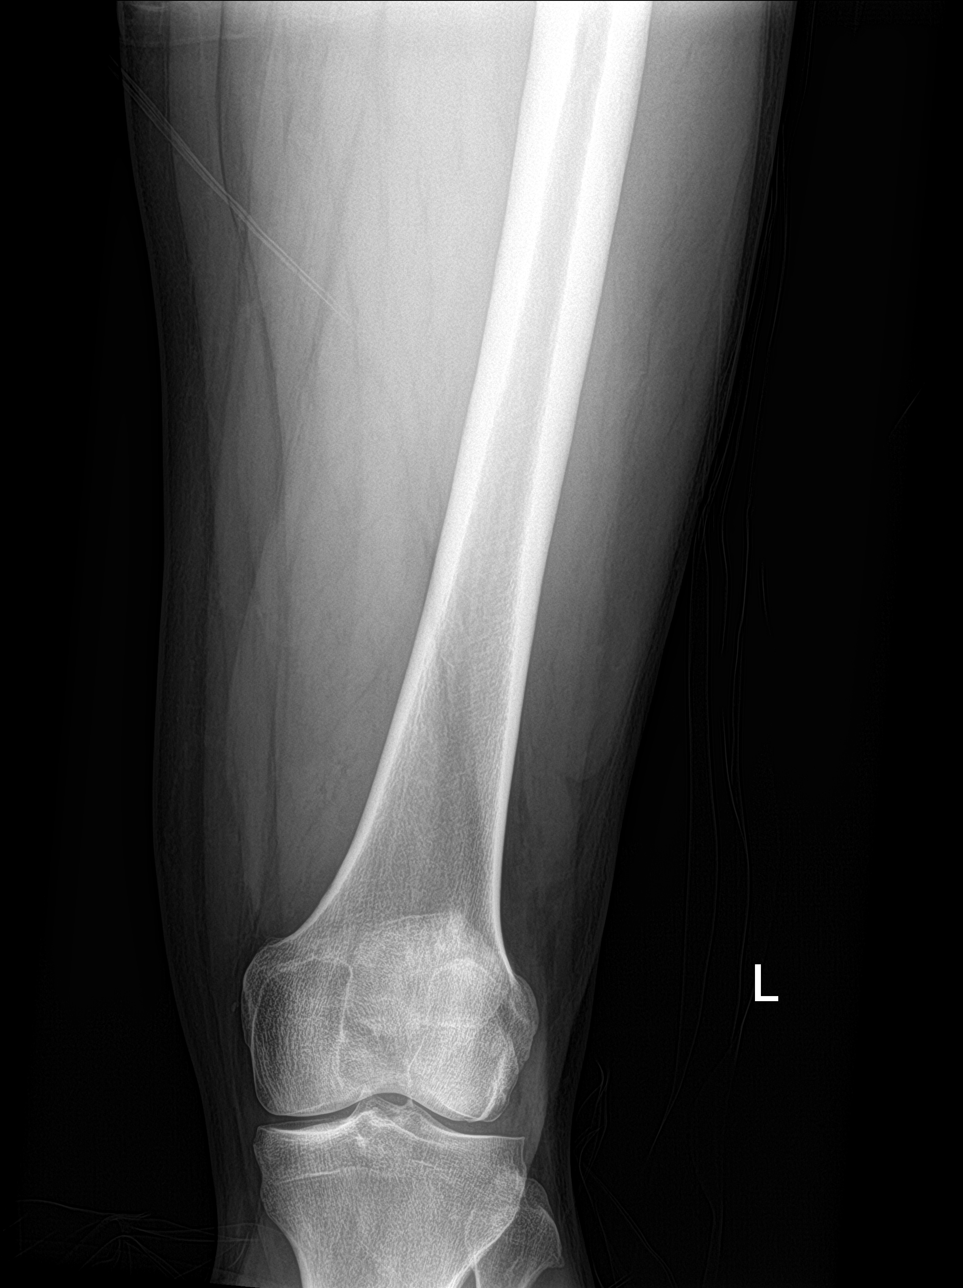

[femur lat (1 of 2)]
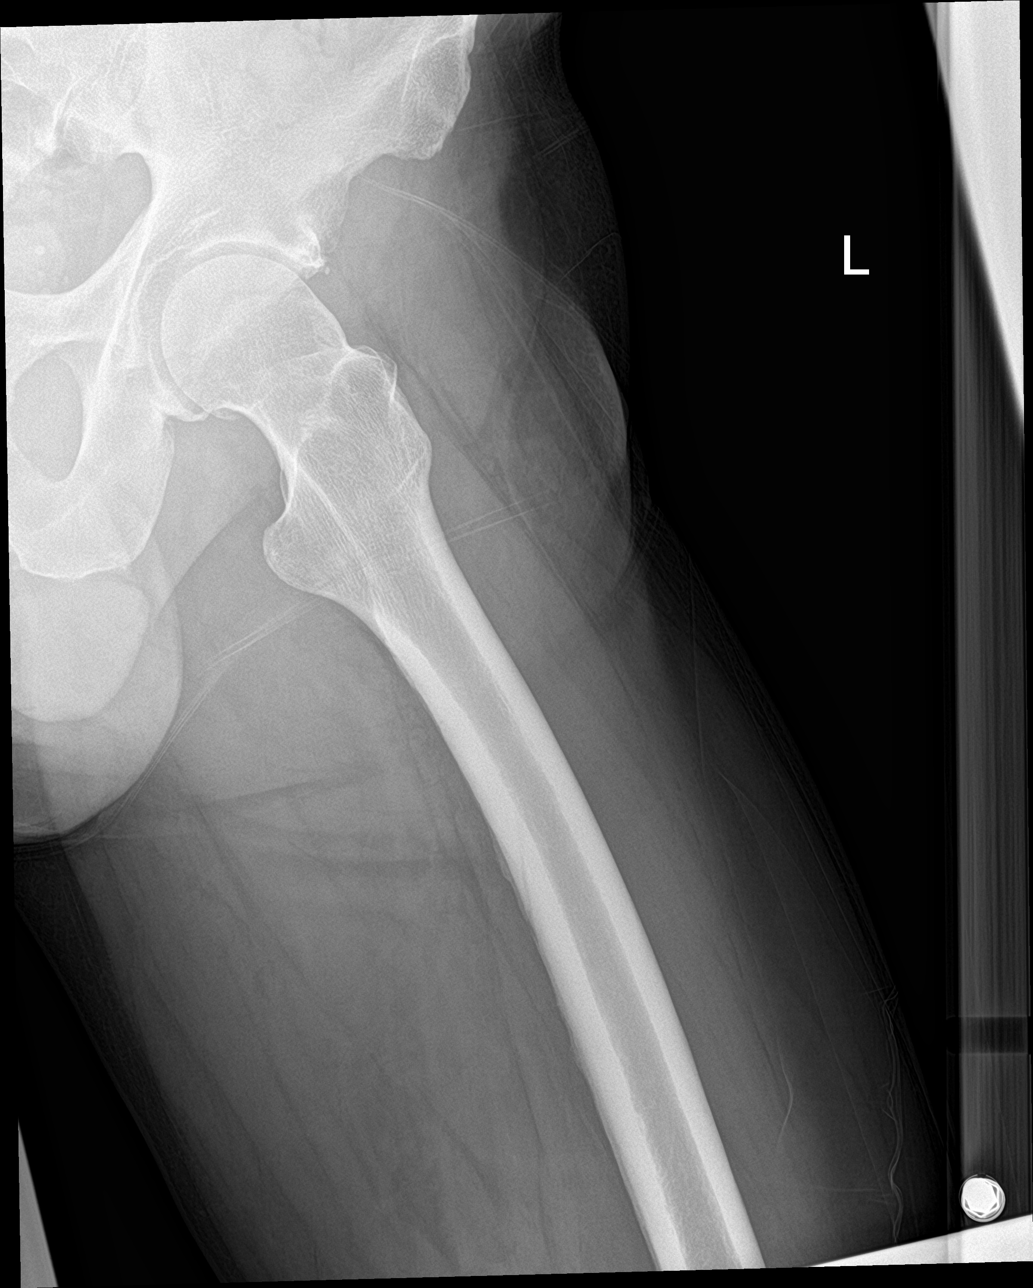

[femur lat (2 of 2)]
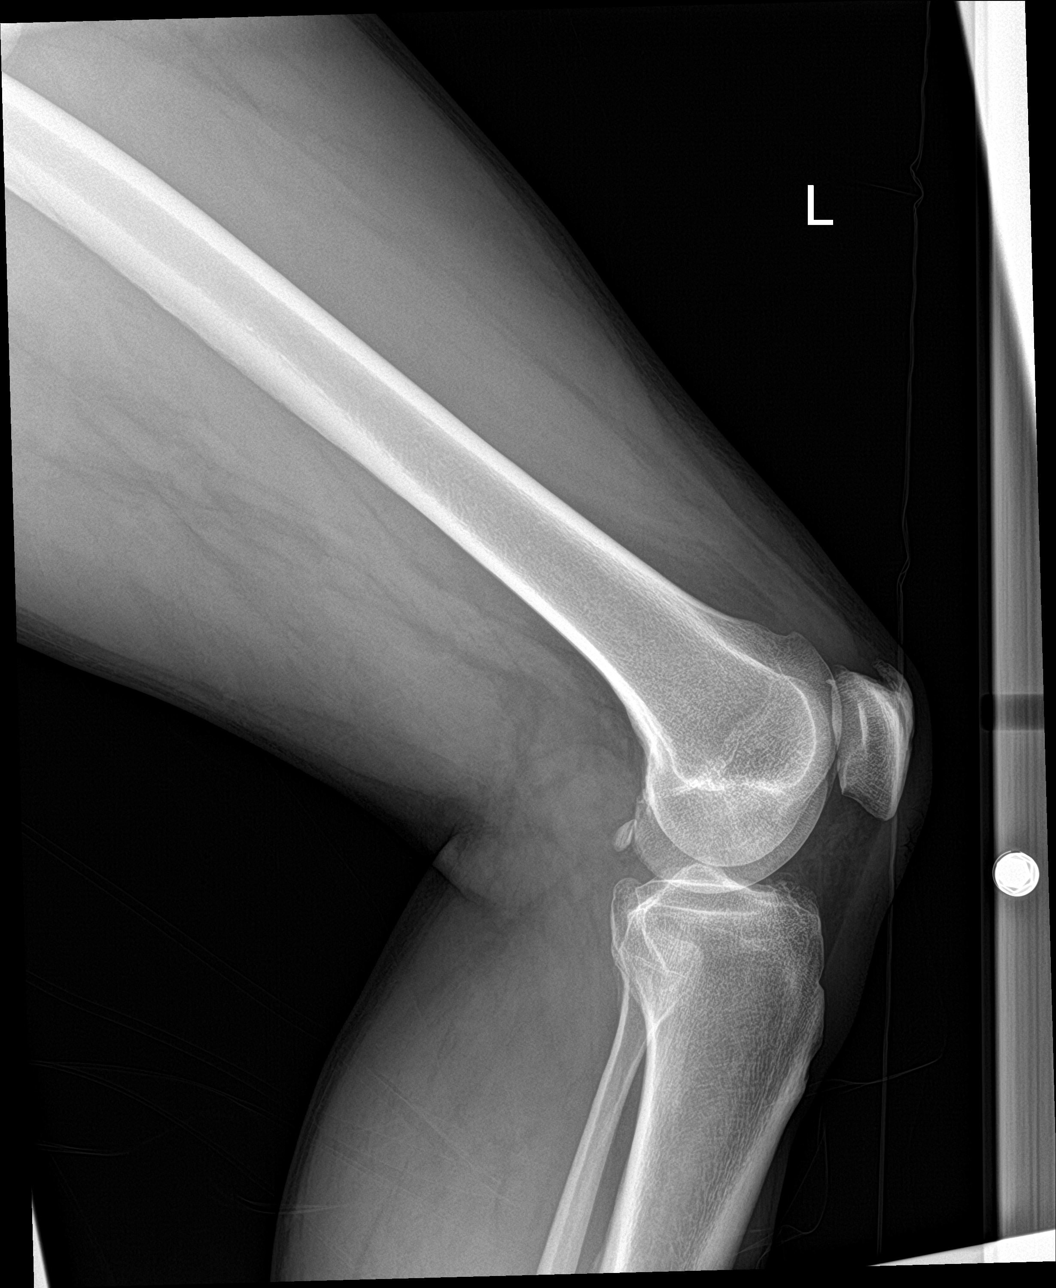

[4 of 4 positions shown; findings below may reference images not displayed]

FINDINGS: There are moderate degenerative changes of the left hip. There is no
evidence for an acute displaced fracture or dislocation. There
appears to be soft tissue swelling about the proximal left femur.
IMPRESSION: 1. No acute displaced fracture or dislocation.
2. Moderate degenerative changes of the left hip.

## 2021-04-11 ENCOUNTER — Other Ambulatory Visit: Payer: Self-pay

## 2021-04-11 ENCOUNTER — Other Ambulatory Visit (HOSPITAL_COMMUNITY): Payer: Self-pay

## 2021-04-11 DIAGNOSIS — Z8673 Personal history of transient ischemic attack (TIA), and cerebral infarction without residual deficits: Secondary | ICD-10-CM

## 2021-04-11 MED ORDER — CLOPIDOGREL BISULFATE 75 MG PO TABS
ORAL_TABLET | Freq: Every day | ORAL | 3 refills | Status: DC
  Filled 2021-04-11: qty 90, 90d supply, fill #0
  Filled 2021-08-16: qty 90, 90d supply, fill #1
  Filled 2022-02-09: qty 90, 90d supply, fill #2

## 2021-05-06 ENCOUNTER — Other Ambulatory Visit (HOSPITAL_COMMUNITY): Payer: Self-pay

## 2021-06-21 ENCOUNTER — Other Ambulatory Visit (HOSPITAL_COMMUNITY): Payer: Self-pay

## 2021-07-27 ENCOUNTER — Encounter: Payer: Self-pay | Admitting: Physician Assistant

## 2021-07-27 DIAGNOSIS — R7989 Other specified abnormal findings of blood chemistry: Secondary | ICD-10-CM

## 2021-07-27 DIAGNOSIS — R030 Elevated blood-pressure reading, without diagnosis of hypertension: Secondary | ICD-10-CM

## 2021-08-16 ENCOUNTER — Other Ambulatory Visit (HOSPITAL_COMMUNITY): Payer: Self-pay

## 2021-08-17 ENCOUNTER — Other Ambulatory Visit: Payer: Self-pay

## 2021-08-17 ENCOUNTER — Other Ambulatory Visit (HOSPITAL_COMMUNITY): Payer: Self-pay

## 2021-08-17 DIAGNOSIS — N529 Male erectile dysfunction, unspecified: Secondary | ICD-10-CM

## 2021-08-17 DIAGNOSIS — Z8673 Personal history of transient ischemic attack (TIA), and cerebral infarction without residual deficits: Secondary | ICD-10-CM

## 2021-08-17 MED ORDER — SERTRALINE HCL 100 MG PO TABS
100.0000 mg | ORAL_TABLET | Freq: Every day | ORAL | 1 refills | Status: DC
  Filled 2021-08-17 – 2021-11-22 (×2): qty 90, 90d supply, fill #0
  Filled 2022-02-09: qty 90, 90d supply, fill #1

## 2021-08-17 MED ORDER — ROSUVASTATIN CALCIUM 10 MG PO TABS
10.0000 mg | ORAL_TABLET | Freq: Every day | ORAL | 1 refills | Status: DC
  Filled 2021-08-17: qty 90, 90d supply, fill #0
  Filled 2022-02-09: qty 90, 90d supply, fill #1

## 2021-08-17 MED ORDER — "BD LUER-LOK SYRINGE 22G X 1-1/2"" 3 ML MISC"
1 refills | Status: AC
  Filled 2021-08-17: qty 6, 84d supply, fill #0

## 2021-08-17 MED ORDER — TESTOSTERONE CYPIONATE 200 MG/ML IM SOLN
200.0000 mg | INTRAMUSCULAR | 0 refills | Status: DC
  Filled 2021-08-17: qty 6, 84d supply, fill #0
  Filled 2021-11-23: qty 4, 56d supply, fill #1

## 2021-08-17 MED ORDER — TADALAFIL 20 MG PO TABS
20.0000 mg | ORAL_TABLET | Freq: Every day | ORAL | 1 refills | Status: DC | PRN
  Filled 2021-08-17: qty 18, 18d supply, fill #0
  Filled 2022-02-09: qty 18, 18d supply, fill #1

## 2021-08-17 NOTE — Telephone Encounter (Signed)
Patient called requesting a med refill for testosterone/syr/needles. Rxs pended.

## 2021-08-30 LAB — COMPLETE METABOLIC PANEL WITH GFR
AG Ratio: 1.7 (calc) (ref 1.0–2.5)
ALT: 12 U/L (ref 9–46)
AST: 16 U/L (ref 10–35)
Albumin: 4.1 g/dL (ref 3.6–5.1)
Alkaline phosphatase (APISO): 96 U/L (ref 35–144)
BUN: 9 mg/dL (ref 7–25)
CO2: 25 mmol/L (ref 20–32)
Calcium: 9.2 mg/dL (ref 8.6–10.3)
Chloride: 102 mmol/L (ref 98–110)
Creat: 0.97 mg/dL (ref 0.70–1.30)
Globulin: 2.4 g/dL (calc) (ref 1.9–3.7)
Glucose, Bld: 97 mg/dL (ref 65–139)
Potassium: 3.7 mmol/L (ref 3.5–5.3)
Sodium: 139 mmol/L (ref 135–146)
Total Bilirubin: 0.5 mg/dL (ref 0.2–1.2)
Total Protein: 6.5 g/dL (ref 6.1–8.1)
eGFR: 93 mL/min/{1.73_m2} (ref >=60)

## 2021-08-30 LAB — TESTOSTERONE: Testosterone: 761 ng/dL (ref 250–827)

## 2021-08-30 NOTE — Progress Notes (Signed)
Testosterone looks great. Same dose of testosterone.  Kidney and liver look great.

## 2021-11-01 ENCOUNTER — Ambulatory Visit (INDEPENDENT_AMBULATORY_CARE_PROVIDER_SITE_OTHER): Payer: No Typology Code available for payment source | Admitting: Physician Assistant

## 2021-11-01 ENCOUNTER — Other Ambulatory Visit (HOSPITAL_COMMUNITY): Payer: Self-pay

## 2021-11-01 ENCOUNTER — Encounter: Payer: Self-pay | Admitting: Physician Assistant

## 2021-11-01 VITALS — BP 162/98 | HR 60 | Ht 67.0 in | Wt 203.0 lb

## 2021-11-01 DIAGNOSIS — Z0000 Encounter for general adult medical examination without abnormal findings: Secondary | ICD-10-CM

## 2021-11-01 DIAGNOSIS — Z131 Encounter for screening for diabetes mellitus: Secondary | ICD-10-CM

## 2021-11-01 DIAGNOSIS — Z13220 Encounter for screening for lipoid disorders: Secondary | ICD-10-CM

## 2021-11-01 DIAGNOSIS — M25522 Pain in left elbow: Secondary | ICD-10-CM

## 2021-11-01 DIAGNOSIS — G8929 Other chronic pain: Secondary | ICD-10-CM

## 2021-11-01 DIAGNOSIS — E291 Testicular hypofunction: Secondary | ICD-10-CM

## 2021-11-01 DIAGNOSIS — Z23 Encounter for immunization: Secondary | ICD-10-CM

## 2021-11-01 DIAGNOSIS — I10 Essential (primary) hypertension: Secondary | ICD-10-CM

## 2021-11-01 DIAGNOSIS — M25561 Pain in right knee: Secondary | ICD-10-CM

## 2021-11-01 MED ORDER — CELECOXIB 200 MG PO CAPS
ORAL_CAPSULE | Freq: Two times a day (BID) | ORAL | 1 refills | Status: DC
  Filled 2021-11-01: qty 180, 90d supply, fill #0
  Filled 2022-02-09: qty 180, 90d supply, fill #1

## 2021-11-01 NOTE — Progress Notes (Signed)
Complete physical exam  Patient: Zachary Massey   DOB: 1966/02/18   56 y.o. Male  MRN: [id number]  Subjective:    Chief Complaint  Patient presents with   Annual Exam    Zachary Massey is a 56 y.o. male who presents today for a complete physical exam. He reports consuming a general diet. The patient does not participate in regular exercise at present. He generally feels fairly well. He reports sleeping well. He does not have additional problems to discuss today.     Most recent depression screenings:    11/01/2021   11:25 AM 12/06/2020    3:58 PM  PHQ 2/9 Scores  PHQ - 2 Score 2 0  PHQ- 9 Score 6 1    Vision:Within last year, Dental: No current dental problems, and PSA: Agrees to PSA testing  Patient Active Problem List   Diagnosis Date Noted   Primary hypertension 11/01/2021   Low testosterone 12/06/2020   Septic olecranon bursitis, right 06/08/2020   Trigger finger, left middle finger 06/08/2020   Colon polyp 01/07/2020   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   Elevated blood pressure reading 12/12/2019   Chronic pain of right knee 12/12/2019   Left elbow pain 12/12/2019   Posttraumatic osteoarthritis of right third PIP 05/16/2019   History of stroke 10/01/2018   Hypertriglyceridemia 09/30/2018   Encounter for examination required by Department of Transportation (DOT) 09/04/2018   Oral pain 07/17/2018   Screening for colon cancer 08/15/2017   Vitamin D deficiency 06/23/2016   Hyperlipidemia 06/23/2016   Screening for diabetes mellitus 06/23/2016   Fatigue 06/23/2016   Health care maintenance 06/23/2016   Ganglion cyst of wrist, left 02/25/2016   Memory loss 02/03/2015   Erectile dysfunction 07/13/2013   Hypogonadism male 07/13/2013   Generalized anxiety disorder 07/13/2013   Premature ejaculation 07/13/2013   Ichthyosis 04/09/2012   GERD (gastroesophageal reflux disease) 12/03/2011   History of anemia 12/01/2011   History of TIA (transient ischemic attack)  12/01/2011   Past Medical History:  Diagnosis Date   Anxiety    Depression    Erectile dysfunction    GERD (gastroesophageal reflux disease)    GERD (gastroesophageal reflux disease)    Hypogonadism male    Premature ejaculation    Stroke ([place]) 03/13/2006   TIA (aphasia; admitted; Plavix therapy; maintained on Plavix.   TIA (transient ischemic attack)    Family History  Problem Relation Age of Onset   Hyperlipidemia Mother    Cancer Paternal Grandfather        stomach, esophageal   Heart attack Maternal Grandmother    Stroke Paternal Grandmother    Dementia Paternal Grandmother    No Known Allergies    Patient Care Team: Lavada Mesi as PCP - General (Family Medicine) Garvin Fila, MD as Consulting Physician (Neurology) Ronald Lobo, MD as Consulting Physician (Gastroenterology) Kathie Rhodes, MD (Inactive) as Consulting Physician (Urology)   Outpatient Medications Prior to Visit  Medication Sig   clopidogrel (PLAVIX) 75 MG tablet TAKE 1 TABLET BY MOUTH DAILY.   cyclobenzaprine (FLEXERIL) 10 MG tablet Take 1 tablet (10 mg total) by mouth 2 (two) times daily as needed for muscle spasms.   NEEDLE, DISP, 18 G 18G X 1-1/2" MISC Use to draw up testosterone every 14 days   Omeprazole-Sodium Bicarbonate (ZEGERID OTC PO) Take 2 tablets by mouth daily.   rosuvastatin (CRESTOR) 10 MG tablet Take 1 tablet (10 mg total) by mouth daily.   sertraline (  ZOLOFT) 100 MG tablet Take 1 tablet (100 mg total) by mouth daily.   SYRINGE-NEEDLE, DISP, 3 ML (B-D 3CC LUER-LOK SYR 22GX1-1/2) 22G X 1-1/2" 3 ML MISC Use to inject Testosterone every 14 days   tadalafil (CIALIS) 20 MG tablet Take 1 tablet (20 mg total) by mouth daily as needed for erectile dysfunction.   testosterone cypionate (DEPOTESTOSTERONE CYPIONATE) 200 MG/ML injection Inject 1 mL (200 mg total) into the muscle every 14 (fourteen) days.   [DISCONTINUED] celecoxib (CELEBREX) 200 MG capsule TAKE 1 CAPSULE BY MOUTH TWICE  DAILY   No facility-administered medications prior to visit.    Review of Systems  All other systems reviewed and are negative.         Objective:     BP (!) 162/98   Pulse 60   Ht '5\' 7"'$  (1.702 m)   Wt 203 lb (92.1 kg)   SpO2 98%   BMI 31.79 kg/m  BP Readings from Last 3 Encounters:  11/01/21 (!) 162/98  12/06/20 136/86  07/26/20 (!) 148/97   Wt Readings from Last 3 Encounters:  11/01/21 203 lb (92.1 kg)  12/06/20 201 lb (91.2 kg)  07/26/20 198 lb (89.8 kg)      Physical Exam  BP (!) 162/98   Pulse 60   Ht '5\' 7"'$  (1.702 m)   Wt 203 lb (92.1 kg)   SpO2 98%   BMI 31.79 kg/m   General Appearance:    Alert, cooperative,obese no distress, appears stated age  Head:    Normocephalic, without obvious abnormality, atraumatic  Eyes:    PERRL, conjunctiva/corneas clear, EOM's intact, fundi    benign, both eyes       Ears:    Normal TM's and external ear canals, both ears  Nose:   Nares normal, septum midline, mucosa normal, no drainage    or sinus tenderness  Throat:   Lips, mucosa, and tongue normal; teeth and gums normal  Neck:   Supple, symmetrical, trachea midline, no adenopathy;       thyroid:  No enlargement/tenderness/nodules; no carotid   bruit or JVD  Back:     Symmetric, no curvature, ROM normal, no CVA tenderness  Lungs:     Clear to auscultation bilaterally, respirations unlabored  Chest wall:    No tenderness or deformity  Heart:    Regular rate and rhythm, S1 and S2 normal, no murmur, rub   or gallop  Abdomen:     Soft, non-tender, bowel sounds active all four quadrants,    no masses, no organomegaly        Extremities:   Extremities normal, atraumatic, no cyanosis or edema  Pulses:   2+ and symmetric all extremities  Skin:   Skin color, texture, turgor normal, no rashes or lesions  Lymph nodes:   Cervical, supraclavicular, and axillary nodes normal  Neurologic:   CNII-XII intact. Normal strength, sensation and reflexes      throughout       Assessment & Plan:    Routine Health Maintenance and Physical Exam  Immunization History  Administered Date(s) Administered   Influenza,inj,Quad PF,6+ Mos 12/31/2017, 12/10/2019, 12/06/2020, 11/01/2021   Influenza-Unspecified 01/12/2015, 04/18/2019   PFIZER Comirnaty(Gray Top)Covid-19 Tri-Sucrose Vaccine 08/03/2020   PFIZER(Purple Top)SARS-COV-2 Vaccination 06/14/2019, 07/08/2019   Tdap 07/12/2009, 06/16/2015   Zoster Recombinat (Shingrix) 12/06/2020, 11/01/2021    Health Maintenance  Topic Date Due   HIV Screening  Never done   Hepatitis C Screening  Never done   COVID-19 Vaccine (4 -  Pfizer risk series) 09/28/2020   TETANUS/TDAP  06/15/2025   COLONOSCOPY (Pts 45-16yr Insurance coverage will need to be confirmed)  02/26/2026   INFLUENZA VACCINE  Completed   Zoster Vaccines- Shingrix  Completed   HPV VACCINES  Aged Out    Discussed health benefits of physical activity, and encouraged him to engage in regular exercise appropriate for his age and condition.  .Marland KitchenAltamese Dillingwas seen today for annual exam.  Diagnoses and all orders for this visit:  Routine physical examination -     PSA -     TSH -     Lipid Panel w/reflex Direct LDL -     COMPLETE METABOLIC PANEL WITH GFR -     CBC with Differential/Platelet -     Testosterone Total,Free,Bio, Males  Hypogonadism male -     PSA -     Lipid Panel w/reflex Direct LDL -     CBC with Differential/Platelet -     Testosterone Total,Free,Bio, Males  Screening for lipid disorders -     Lipid Panel w/reflex Direct LDL  Screening for diabetes mellitus -     COMPLETE METABOLIC PANEL WITH GFR  Primary hypertension  Chronic elbow pain, left -     celecoxib (CELEBREX) 200 MG capsule; TAKE 1 CAPSULE BY MOUTH TWICE DAILY  Chronic pain of right knee -     celecoxib (CELEBREX) 200 MG capsule; TAKE 1 CAPSULE BY MOUTH TWICE DAILY  Need for shingles vaccine -     Zoster Recombinant (Shingrix )  Need for influenza vaccination -      Flu Vaccine QUAD 655moM (Fluarix, Fluzone & Alfiuria Quad PF)    ..SMarland Kitchenart a regular exercise program and make sure you are eating a healthy diet Try to eat 4 servings of dairy a day or take a calcium supplement ('500mg'$  twice a day). Your vaccines are up to date.  Shingles and flu vaccine given today.  Fasting labs ordered.  PSA ordered due to testosterone and age Will refill testosterone accordinly PHQ/GAD not to goal. His wife is sick and having a lot of testing done and he is worried about her.  Declines any medication BP not to goal Discussed HTN. Pt does not smoke or drink alcohol. Discuss low sat diet and increasing plants and fruits. I would love for him to start walking daily. Start checking at home with goal under 130/80 due to CV risk and history and follow up in 1 month.  Colonoscopy UTD  Return in about 4 weeks (around 11/29/2021) for F/U HTN.     JaIran PlanasPA-C

## 2021-11-01 NOTE — Progress Notes (Deleted)
Complete physical exam  Patient: Zachary Massey   DOB: 20-Dec-1965   56 y.o. Male  MRN: [id number]  Subjective:    Chief Complaint  Patient presents with   Annual Exam    Zachary Massey is a 56 y.o. male who presents today for a complete physical exam. He reports consuming a {diet types:17450} diet. {types:19826} He generally feels {DESC; WELL/FAIRLY WELL/POORLY:18703}. He reports sleeping {DESC; WELL/FAIRLY WELL/POORLY:18703}. He {does/does not:200015} have additional problems to discuss today.    Most recent fall risk assessment:    12/06/2020    3:59 PM  [employer] in the past year? 1  Number falls in past yr: 0  Injury with Fall? 0  Risk for fall due to : No Fall Risks  Follow up Falls evaluation completed     Most recent depression screenings:    12/06/2020    3:58 PM 07/26/2020    1:37 PM  PHQ 2/9 Scores  PHQ - 2 Score 0 0  PHQ- 9 Score 1 0    {VISON DENTAL STD PSA (Optional):27386}  {History (Optional):23778}  Patient Care Team: Lavada Mesi as PCP - General (Family Medicine) Garvin Fila, MD as Consulting Physician (Neurology) Ronald Lobo, MD as Consulting Physician (Gastroenterology) Kathie Rhodes, MD (Inactive) as Consulting Physician (Urology)   Outpatient Medications Prior to Visit  Medication Sig   celecoxib (CELEBREX) 200 MG capsule TAKE 1 CAPSULE BY MOUTH TWICE DAILY   clopidogrel (PLAVIX) 75 MG tablet TAKE 1 TABLET BY MOUTH DAILY.   cyclobenzaprine (FLEXERIL) 10 MG tablet Take 1 tablet (10 mg total) by mouth 2 (two) times daily as needed for muscle spasms.   NEEDLE, DISP, 18 G 18G X 1-1/2" MISC Use to draw up testosterone every 14 days   Omeprazole-Sodium Bicarbonate (ZEGERID OTC PO) Take 2 tablets by mouth daily.   rosuvastatin (CRESTOR) 10 MG tablet Take 1 tablet (10 mg total) by mouth daily.   sertraline (ZOLOFT) 100 MG tablet Take 1 tablet (100 mg total) by mouth daily.   SYRINGE-NEEDLE, DISP, 3 ML (B-D 3CC LUER-LOK SYR  22GX1-1/2) 22G X 1-1/2" 3 ML MISC Use to inject Testosterone every 14 days   tadalafil (CIALIS) 20 MG tablet Take 1 tablet (20 mg total) by mouth daily as needed for erectile dysfunction.   testosterone cypionate (DEPOTESTOSTERONE CYPIONATE) 200 MG/ML injection Inject 1 mL (200 mg total) into the muscle every 14 (fourteen) days.   No facility-administered medications prior to visit.    ROS        Objective:     BP (!) 165/99   Pulse 60   Ht '5\' 7"'$  (1.702 m)   Wt 203 lb (92.1 kg)   SpO2 98%   BMI 31.79 kg/m  BP Readings from Last 3 Encounters:  11/01/21 (!) 165/99  12/06/20 136/86  07/26/20 (!) 148/97   Wt Readings from Last 3 Encounters:  11/01/21 203 lb (92.1 kg)  12/06/20 201 lb (91.2 kg)  07/26/20 198 lb (89.8 kg)      Physical Exam   No results found for any visits on 11/01/21. {Show previous labs (optional):23779}    Assessment & Plan:    Routine Health Maintenance and Physical Exam  Immunization History  Administered Date(s) Administered   Influenza,inj,Quad PF,6+ Mos 12/31/2017, 12/10/2019, 12/06/2020   Influenza-Unspecified 01/12/2015, 04/18/2019   PFIZER Comirnaty(Gray Top)Covid-19 Tri-Sucrose Vaccine 08/03/2020   PFIZER(Purple Top)SARS-COV-2 Vaccination 06/14/2019, 07/08/2019   Tdap 07/12/2009, 06/16/2015   Zoster Recombinat (Shingrix) 12/06/2020  Health Maintenance  Topic Date Due   HIV Screening  Never done   Hepatitis C Screening  Never done   COVID-19 Vaccine (4 - Pfizer risk series) 09/28/2020   Zoster Vaccines- Shingrix (2 of 2) 01/31/2021   INFLUENZA VACCINE  10/11/2021   TETANUS/TDAP  06/15/2025   COLONOSCOPY (Pts 45-9yr Insurance coverage will need to be confirmed)  02/26/2026   HPV VACCINES  Aged Out    Discussed health benefits of physical activity, and encouraged him to engage in regular exercise appropriate for his age and condition.  Problem List Items Addressed This Visit   None  No follow-ups on file.     JIran Planas PA-C

## 2021-11-01 NOTE — Patient Instructions (Signed)
Managing Your Hypertension Hypertension, also called high blood pressure, is when the force of the blood pressing against the walls of the arteries is too strong. Arteries are blood vessels that carry blood from your heart throughout your body. Hypertension forces the heart to work harder to pump blood and may cause the arteries to become narrow or stiff. Understanding blood pressure readings A blood pressure reading includes a higher number over a lower number: The first, or top, number is called the systolic pressure. It is a measure of the pressure in your arteries as your heart beats. The second, or bottom number, is called the diastolic pressure. It is a measure of the pressure in your arteries as the heart relaxes. For most people, a normal blood pressure is below 120/80. Your personal target blood pressure may vary depending on your medical conditions, your age, and other factors. Blood pressure is classified into four stages. Based on your blood pressure reading, your health care provider may use the following stages to determine what type of treatment you need, if any. Systolic pressure and diastolic pressure are measured in a unit called millimeters of mercury (mmHg). Normal Systolic pressure: below 120. Diastolic pressure: below 80. Elevated Systolic pressure: 120-129. Diastolic pressure: below 80. Hypertension stage 1 Systolic pressure: 130-139. Diastolic pressure: 80-89. Hypertension stage 2 Systolic pressure: 140 or above. Diastolic pressure: 90 or above. How can this condition affect me? Managing your hypertension is very important. Over time, hypertension can damage the arteries and decrease blood flow to parts of the body, including the brain, heart, and kidneys. Having untreated or uncontrolled hypertension can lead to: A heart attack. A stroke. A weakened blood vessel (aneurysm). Heart failure. Kidney damage. Eye damage. Memory and concentration problems. Vascular  dementia. What actions can I take to manage this condition? Hypertension can be managed by making lifestyle changes and possibly by taking medicines. Your health care provider will help you make a plan to bring your blood pressure within a normal range. You may be referred for counseling on a healthy diet and physical activity. Nutrition  Eat a diet that is high in fiber and potassium, and low in salt (sodium), added sugar, and fat. An example eating plan is called the DASH diet. DASH stands for Dietary Approaches to Stop Hypertension. To eat this way: Eat plenty of fresh fruits and vegetables. Try to fill one-half of your plate at each meal with fruits and vegetables. Eat whole grains, such as whole-wheat pasta, brown rice, or whole-grain bread. Fill about one-fourth of your plate with whole grains. Eat low-fat dairy products. Avoid fatty cuts of meat, processed or cured meats, and poultry with skin. Fill about one-fourth of your plate with lean proteins such as fish, chicken without skin, beans, eggs, and tofu. Avoid pre-made and processed foods. These tend to be higher in sodium, added sugar, and fat. Reduce your daily sodium intake. Many people with hypertension should eat less than 1,500 mg of sodium a day. Lifestyle  Work with your health care provider to maintain a healthy body weight or to lose weight. Ask what an ideal weight is for you. Get at least 30 minutes of exercise that causes your heart to beat faster (aerobic exercise) most days of the week. Activities may include walking, swimming, or biking. Include exercise to strengthen your muscles (resistance exercise), such as weight lifting, as part of your weekly exercise routine. Try to do these types of exercises for 30 minutes at least 3 days a week. Do   not use any products that contain nicotine or tobacco. These products include cigarettes, chewing tobacco, and vaping devices, such as e-cigarettes. If you need help quitting, ask your  health care provider. Control any long-term (chronic) conditions you have, such as high cholesterol or diabetes. Identify your sources of stress and find ways to manage stress. This may include meditation, deep breathing, or making time for fun activities. Alcohol use Do not drink alcohol if: Your health care provider tells you not to drink. You are pregnant, may be pregnant, or are planning to become pregnant. If you drink alcohol: Limit how much you have to: 0-1 drink a day for women. 0-2 drinks a day for men. Know how much alcohol is in your drink. In the [place] one drink equals one 12 oz bottle of beer (355 mL), one 5 oz glass of wine (148 mL), or one 1 oz glass of hard liquor (44 mL). Medicines Your health care provider may prescribe medicine if lifestyle changes are not enough to get your blood pressure under control and if: Your systolic blood pressure is 130 or higher. Your diastolic blood pressure is 80 or higher. Take medicines only as told by your health care provider. Follow the directions carefully. Blood pressure medicines must be taken as told by your health care provider. The medicine does not work as well when you skip doses. Skipping doses also puts you at risk for problems. Monitoring Before you monitor your blood pressure: Do not smoke, drink caffeinated beverages, or exercise within 30 minutes before taking a measurement. Use the bathroom and empty your bladder (urinate). Sit quietly for at least 5 minutes before taking measurements. Monitor your blood pressure at home as told by your health care provider. To do this: Sit with your back straight and supported. Place your feet flat on the floor. Do not cross your legs. Support your arm on a flat surface, such as a table. Make sure your upper arm is at heart level. Each time you measure, take two or three readings one minute apart and record the results. You may also need to have your blood pressure checked regularly by  your health care provider. General information Talk with your health care provider about your diet, exercise habits, and other lifestyle factors that may be contributing to hypertension. Review all the medicines you take with your health care provider because there may be side effects or interactions. Keep all follow-up visits. Your health care provider can help you create and adjust your plan for managing your high blood pressure. Where to find more information National Heart, Lung, and Blood Institute: www.nhlbi.nih.gov American Heart Association: www.heart.org Contact a health care provider if: You think you are having a reaction to medicines you have taken. You have repeated (recurrent) headaches. You feel dizzy. You have swelling in your ankles. You have trouble with your vision. Get help right away if: You develop a severe headache or confusion. You have unusual weakness or numbness, or you feel faint. You have severe pain in your chest or abdomen. You vomit repeatedly. You have trouble breathing. These symptoms may be an emergency. Get help right away. Call 911. Do not wait to see if the symptoms will go away. Do not drive yourself to the hospital. Summary Hypertension is when the force of blood pumping through your arteries is too strong. If this condition is not controlled, it may put you at risk for serious complications. Your personal target blood pressure may vary depending on your medical conditions,   your age, and other factors. For most people, a normal blood pressure is less than 120/80. Hypertension is managed by lifestyle changes, medicines, or both. Lifestyle changes to help manage hypertension include losing weight, eating a healthy, low-sodium diet, exercising more, stopping smoking, and limiting alcohol. This information is not intended to replace advice given to you by your health care provider. Make sure you discuss any questions you have with your health care  provider. Document Revised: 11/11/2020 Document Reviewed: 11/11/2020 Elsevier Patient Education  2023 Elsevier Inc.  

## 2021-11-22 ENCOUNTER — Other Ambulatory Visit (HOSPITAL_COMMUNITY): Payer: Self-pay

## 2021-11-23 ENCOUNTER — Other Ambulatory Visit (HOSPITAL_COMMUNITY): Payer: Self-pay

## 2021-11-29 ENCOUNTER — Ambulatory Visit: Payer: No Typology Code available for payment source | Admitting: Physician Assistant

## 2022-01-02 ENCOUNTER — Other Ambulatory Visit (HOSPITAL_COMMUNITY): Payer: Self-pay

## 2022-01-02 ENCOUNTER — Ambulatory Visit (INDEPENDENT_AMBULATORY_CARE_PROVIDER_SITE_OTHER): Payer: No Typology Code available for payment source | Admitting: Physician Assistant

## 2022-01-02 ENCOUNTER — Encounter: Payer: Self-pay | Admitting: Physician Assistant

## 2022-01-02 VITALS — BP 160/102 | HR 68 | Ht 67.0 in | Wt 197.0 lb

## 2022-01-02 DIAGNOSIS — L739 Follicular disorder, unspecified: Secondary | ICD-10-CM | POA: Diagnosis not present

## 2022-01-02 MED ORDER — CEPHALEXIN 500 MG PO CAPS
500.0000 mg | ORAL_CAPSULE | Freq: Two times a day (BID) | ORAL | 0 refills | Status: AC
  Filled 2022-01-02: qty 14, 7d supply, fill #0

## 2022-01-02 MED ORDER — MUPIROCIN 2 % EX OINT
TOPICAL_OINTMENT | CUTANEOUS | 0 refills | Status: DC
  Filled 2022-01-02: qty 22, 7d supply, fill #0

## 2022-01-02 NOTE — Progress Notes (Signed)
   Acute Office Visit  Subjective:     Patient ID: Zachary Massey, male    DOB: March 08, 1966, 56 y.o.   MRN: [id number]  Chief Complaint  Patient presents with   Rash    HPI Patient is in today for rash on the back of his neck for the last 2 weeks. Some bumps have pus in them. Denies any itching or hurting. Not had anything like this before. Not treated with anything.   ROS See HPI.      Objective:    BP (!) 160/102   Pulse 68   Ht '5\' 7"'$  (1.702 m)   Wt 197 lb (89.4 kg)   SpO2 97%   BMI 30.85 kg/m  BP Readings from Last 3 Encounters:  01/02/22 (!) 160/102  11/01/21 (!) 162/98  12/06/20 136/86   Wt Readings from Last 3 Encounters:  01/02/22 197 lb (89.4 kg)  11/01/21 203 lb (92.1 kg)  12/06/20 201 lb (91.2 kg)      Physical Exam  Back of neck erythematous papules with some pustules.      Assessment & Plan:  Marland KitchenMarland KitchenTarren was seen today for rash.  Diagnoses and all orders for this visit:  Folliculitis -     cephALEXin (KEFLEX) 500 MG capsule; Take 1 capsule (500 mg total) by mouth 2 (two) times daily. -     mupirocin ointment (BACTROBAN) 2 %; Apply to affected area twice a day for 7 days as needed.   Discussed prevention with washing with anti-bacterial soap Start keflex and bactroban for 7 days. Follow up as needed or if symptoms worsen or persist.   Iran Planas, PA-C

## 2022-01-02 NOTE — Patient Instructions (Signed)
Folliculitis  Folliculitis is inflammation of the hair follicles. Folliculitis most commonly occurs on the scalp, thighs, legs, back, and buttocks. However, it can occur anywhere on the body. What are the causes? This condition may be caused by: A bacterial infection (common). A fungal infection. A viral infection. Contact with certain chemicals, especially oils and tars. Shaving or waxing. Greasy ointments or creams applied to the skin. Long-lasting folliculitis and folliculitis that keeps coming back may be caused by bacteria. This bacteria can live anywhere on your skin and is often found in the nostrils. What increases the risk? You are more likely to develop this condition if you have: A weakened immune system. Diabetes. Obesity. What are the signs or symptoms? Symptoms of this condition include: Redness. Soreness. Swelling. Itching. Small white or yellow, pus-filled, itchy spots (pustules) that appear over a reddened area. If there is an infection that goes deep into the follicle, these may develop into a boil (furuncle). A group of closely packed boils (carbuncle). These tend to form in hairy, sweaty areas of the body. How is this diagnosed? This condition is diagnosed with a skin exam. To find what is causing the condition, your health care provider may take a sample of one of the pustules or boils for testing in a lab. How is this treated? This condition may be treated by: Applying warm compresses to the affected areas. Taking an antibiotic medicine or applying an antibiotic medicine to the skin. Applying or bathing with an antiseptic solution. Taking an over-the-counter medicine to help with itching. Having a procedure to drain any pustules or boils. This may be done if a pustule or boil contains a lot of pus or fluid. Having laser hair removal. This may be done to treat long-lasting folliculitis. Follow these instructions at home: Managing pain and swelling  If  directed, apply heat to the affected area as often as told by your health care provider. Use the heat source that your health care provider recommends, such as a moist heat pack or a heating pad. Place a towel between your skin and the heat source. Leave the heat on for 20-30 minutes. Remove the heat if your skin turns bright red. This is especially important if you are unable to feel pain, heat, or cold. You may have a greater risk of getting burned. General instructions If you were prescribed an antibiotic medicine, take it or apply it as told by your health care provider. Do not stop using the antibiotic even if your condition improves. Check the irritated area every day for signs of infection. Check for: Redness, swelling, or pain. Fluid or blood. Warmth. Pus or a bad smell. Do not shave irritated skin. Take over-the-counter and prescription medicines only as told by your health care provider. Keep all follow-up visits as told by your health care provider. This is important. Get help right away if: You have more redness, swelling, or pain in the affected area. Red streaks are spreading from the affected area. You have a fever. Summary Folliculitis is inflammation of the hair follicles. Folliculitis most commonly occurs on the scalp, thighs, legs, back, and buttocks. This condition may be treated by taking an antibiotic medicine or applying an antibiotic medicine to the skin, and applying or bathing with an antiseptic solution. If you were prescribed an antibiotic medicine, take it or apply it as told by your health care provider. Do not stop using the antibiotic even if your condition improves. Get help right away if you have new  or worsening symptoms. Keep all follow-up visits as told by your health care provider. This is important. This information is not intended to replace advice given to you by your health care provider. Make sure you discuss any questions you have with your health  care provider. Document Revised: 05/04/2021 Document Reviewed: 12/23/2020 Elsevier Patient Education  [employer].

## 2022-01-04 ENCOUNTER — Other Ambulatory Visit (HOSPITAL_COMMUNITY): Payer: Self-pay

## 2022-02-09 ENCOUNTER — Other Ambulatory Visit (HOSPITAL_COMMUNITY): Payer: Self-pay

## 2022-02-17 ENCOUNTER — Encounter: Payer: Self-pay | Admitting: Family Medicine

## 2022-02-17 ENCOUNTER — Other Ambulatory Visit: Payer: Self-pay | Admitting: Neurology

## 2022-02-17 ENCOUNTER — Other Ambulatory Visit (HOSPITAL_COMMUNITY): Payer: Self-pay

## 2022-02-17 MED ORDER — TESTOSTERONE CYPIONATE 200 MG/ML IM SOLN
200.0000 mg | INTRAMUSCULAR | 0 refills | Status: DC
  Filled 2022-02-17: qty 6, 84d supply, fill #0

## 2022-02-17 NOTE — Telephone Encounter (Signed)
Last appt 01/02/2022  Does need labs collected  Last written 08/17/2021 #10 ml with no refills

## 2022-03-08 ENCOUNTER — Other Ambulatory Visit (HOSPITAL_COMMUNITY): Payer: Self-pay

## 2022-03-09 LAB — TESTOSTERONE TOTAL,FREE,BIO, MALES
Albumin: 4.3 g/dL (ref 3.6–5.1)
Sex Hormone Binding: 10 nmol/L (ref 10–50)
Testosterone, Bioavailable: 648.5 ng/dL — ABNORMAL HIGH (ref 110.0–575.0)
Testosterone, Free: 329.3 pg/mL — ABNORMAL HIGH (ref 46.0–224.0)
Testosterone: 956 ng/dL — ABNORMAL HIGH (ref 250–827)

## 2022-03-09 LAB — COMPLETE METABOLIC PANEL WITH GFR
AG Ratio: 1.7 (calc) (ref 1.0–2.5)
ALT: 10 U/L (ref 9–46)
AST: 16 U/L (ref 10–35)
Albumin: 4.3 g/dL (ref 3.6–5.1)
Alkaline phosphatase (APISO): 101 U/L (ref 35–144)
BUN: 11 mg/dL (ref 7–25)
CO2: 24 mmol/L (ref 20–32)
Calcium: 9.1 mg/dL (ref 8.6–10.3)
Chloride: 104 mmol/L (ref 98–110)
Creat: 0.86 mg/dL (ref 0.70–1.30)
Globulin: 2.6 g/dL (calc) (ref 1.9–3.7)
Glucose, Bld: 117 mg/dL — ABNORMAL HIGH (ref 65–99)
Potassium: 4.2 mmol/L (ref 3.5–5.3)
Sodium: 138 mmol/L (ref 135–146)
Total Bilirubin: 0.6 mg/dL (ref 0.2–1.2)
Total Protein: 6.9 g/dL (ref 6.1–8.1)
eGFR: 103 mL/min/{1.73_m2} (ref >=60)

## 2022-03-09 LAB — CBC WITH DIFFERENTIAL/PLATELET
Absolute Monocytes: 447 cells/uL (ref 200–950)
Basophils Absolute: 69 cells/uL (ref 0–200)
Basophils Relative: 1.1 %
Eosinophils Absolute: 328 cells/uL (ref 15–500)
Eosinophils Relative: 5.2 %
HCT: 44.7 % (ref 38.5–50.0)
Hemoglobin: 14.6 g/dL (ref 13.2–17.1)
Lymphs Abs: 2142 cells/uL (ref 850–3900)
MCH: 24.6 pg — ABNORMAL LOW (ref 27.0–33.0)
MCHC: 32.7 g/dL (ref 32.0–36.0)
MCV: 75.3 fL — ABNORMAL LOW (ref 80.0–100.0)
MPV: 10.7 fL (ref 7.5–12.5)
Monocytes Relative: 7.1 %
Neutro Abs: 3314 cells/uL (ref 1500–7800)
Neutrophils Relative %: 52.6 %
Platelets: 248 10*3/uL (ref 140–400)
RBC: 5.94 10*6/uL — ABNORMAL HIGH (ref 4.20–5.80)
RDW: 15.5 % — ABNORMAL HIGH (ref 11.0–15.0)
Total Lymphocyte: 34 %
WBC: 6.3 10*3/uL (ref 3.8–10.8)

## 2022-03-09 LAB — LIPID PANEL W/REFLEX DIRECT LDL
Cholesterol: 144 mg/dL (ref <200)
HDL: 33 mg/dL — ABNORMAL LOW (ref >=40)
Non-HDL Cholesterol (Calc): 111 mg/dL (calc) (ref <130)
Total CHOL/HDL Ratio: 4.4 (calc) (ref <5.0)
Triglycerides: 418 mg/dL — ABNORMAL HIGH (ref <150)

## 2022-03-09 LAB — DIRECT LDL: Direct LDL: 46 mg/dL (ref <100)

## 2022-03-09 LAB — PSA: PSA: 0.82 ng/mL (ref <=4.00)

## 2022-03-09 LAB — TSH: TSH: 2.5 mIU/L (ref 0.40–4.50)

## 2022-03-10 ENCOUNTER — Telehealth: Payer: Self-pay

## 2022-03-10 ENCOUNTER — Other Ambulatory Visit (HOSPITAL_COMMUNITY): Payer: Self-pay

## 2022-03-10 NOTE — Telephone Encounter (Signed)
Initiated Prior authorization VQQ:[id number] Cypionate '200MG'$ /ML intramuscular solution Via: Covermymeds Case/Key:[id number] Status: approved as of 03/10/22 Reason:The authorization is effective from 03/10/2022 to 03/10/2023 Notified Pt via: Mychart

## 2022-03-10 NOTE — Progress Notes (Signed)
Zachary Massey,   LDL looks great.  Thyroid looks great.  PSA stable.  Kidney, liver, glucose look good.  Testosterone great. A little on high side.  Could decrease a little if having symptoms of testosterone that is too high.

## 2022-03-11 ENCOUNTER — Other Ambulatory Visit (HOSPITAL_COMMUNITY): Payer: Self-pay

## 2022-04-04 ENCOUNTER — Ambulatory Visit (INDEPENDENT_AMBULATORY_CARE_PROVIDER_SITE_OTHER): Payer: BC Managed Care – PPO | Admitting: Physician Assistant

## 2022-04-04 ENCOUNTER — Encounter: Payer: Self-pay | Admitting: Physician Assistant

## 2022-04-04 ENCOUNTER — Other Ambulatory Visit (HOSPITAL_COMMUNITY): Payer: Self-pay

## 2022-04-04 VITALS — BP 168/99 | HR 80 | Temp 97.7°F | Ht 67.0 in | Wt 203.0 lb

## 2022-04-04 DIAGNOSIS — J329 Chronic sinusitis, unspecified: Secondary | ICD-10-CM

## 2022-04-04 DIAGNOSIS — J40 Bronchitis, not specified as acute or chronic: Secondary | ICD-10-CM | POA: Diagnosis not present

## 2022-04-04 MED ORDER — METHYLPREDNISOLONE SODIUM SUCC 125 MG IJ SOLR
125.0000 mg | Freq: Once | INTRAMUSCULAR | Status: AC
  Administered 2022-04-04: 125 mg via INTRAMUSCULAR

## 2022-04-04 MED ORDER — AZITHROMYCIN 250 MG PO TABS
ORAL_TABLET | ORAL | 0 refills | Status: DC
  Filled 2022-04-04: qty 6, 5d supply, fill #0

## 2022-04-04 MED ORDER — PREDNISONE 50 MG PO TABS
50.0000 mg | ORAL_TABLET | Freq: Every day | ORAL | 0 refills | Status: DC
  Filled 2022-04-04: qty 5, 5d supply, fill #0

## 2022-04-04 NOTE — Progress Notes (Signed)
Acute Office Visit  Subjective:     Patient ID: Zachary Massey, male    DOB: 1966/02/18, 57 y.o.   MRN: [id number]  Chief Complaint  Patient presents with   Cough    Congestion for 5 days     Cough Associated symptoms include heartburn and a sore throat. Pertinent negatives include no chest pain, chills, ear pain, fever, myalgias, shortness of breath or wheezing.   Patient is in today for evaluation of cough.   He reports cough and congestion for the last 5 days and has progressively gotten worse. He has only been able to have a little sputum production, which he describes as yellow, but otherwise he coughs and feels that it is stuck in his chest. He states it now hurts to cough because he has been doing it so much. Associated symptoms sore throat, sinus congestion, rhinorrhea. Denies fever, chills, SOB, chest pain, sinus pain, otalgia, abdominal pain, nausea, vomiting, diarrhea, myalgias, headache. His wife had leftover Augmentin which he took for 2 days with no relief. He has also been taking Mucinex and Sudafed with some relief.  He has been around his son who has had similar symptoms but denies traveling recently. He has not tested for Covid. He does not smoke but lives with people that do.   He states he normally gets a sinus infection every year but states this is different.   Review of Systems  Constitutional:  Positive for malaise/fatigue. Negative for chills and fever.  HENT:  Positive for congestion and sore throat. Negative for ear discharge, ear pain and sinus pain.   Respiratory:  Positive for cough and sputum production (minimal). Negative for shortness of breath and wheezing.   Cardiovascular:  Negative for chest pain and palpitations.  Gastrointestinal:  Positive for heartburn. Negative for abdominal pain, constipation, diarrhea, nausea and vomiting.  Musculoskeletal:  Negative for myalgias.  Neurological:  Negative for weakness.        Objective:    BP (!)  168/99 (BP Location: Left Arm, Patient Position: Sitting, Cuff Size: Large)   Pulse 80   Temp 97.7 F (36.5 C)   Ht '5\' 7"'$  (1.702 m)   Wt 203 lb 0.6 oz (92.1 kg)   SpO2 100%   BMI 31.80 kg/m  BP Readings from Last 3 Encounters:  04/04/22 (!) 168/99  01/02/22 (!) 160/102  11/01/21 (!) 162/98   SpO2 Readings from Last 3 Encounters:  04/04/22 100%  01/02/22 97%  11/01/21 98%      Physical Exam Constitutional:      General: He is not in acute distress. HENT:     Head: Normocephalic and atraumatic.     Right Ear: Tympanic membrane normal.     Left Ear: Tympanic membrane normal.     Ears:     Comments: Cerumen present in the right EAC but not impacted.    Nose: Rhinorrhea present.     Mouth/Throat:     Pharynx: Posterior oropharyngeal erythema present.  Eyes:     Conjunctiva/sclera: Conjunctivae normal.  Cardiovascular:     Rate and Rhythm: Normal rate and regular rhythm.     Pulses: Normal pulses.     Heart sounds: No murmur heard. Pulmonary:     Effort: Pulmonary effort is normal.     Breath sounds: Normal breath sounds. No wheezing or rales.  Abdominal:     Tenderness: There is no abdominal tenderness. There is no guarding.  Lymphadenopathy:     Cervical: Cervical adenopathy  present.  Neurological:     Mental Status: He is alert and oriented to person, place, and time.          Assessment & Plan:  Marland KitchenMarland KitchenAmari was seen today for cough.  Diagnoses and all orders for this visit:  Sinobronchitis -     predniSONE (DELTASONE) 50 MG tablet; Take 1 tablet (50 mg total) by mouth daily for 5 days -     azithromycin (ZITHROMAX) 250 MG tablet; Take 2 tablets by mouth on Day 1, then take 1 tablet by mouth daily on Days 2 - 5 -     methylPREDNISolone sodium succinate (SOLU-MEDROL) 125 mg/2 mL injection 125 mg   Likely viral in etiology but since he is nearing the 7 day mark with worsening symptoms, will prescribe a course of azithromycin. Will also send in a burst of  prednisone for 5 days to hopefully help with the inflammation and cough. He would like to go back to work as soon as possible so will give him a Solumedrol injection in the office today as well. A work note has been given to him for yesterday and today. He was advised to return if there is any worsening symptoms, chest pain, hemoptysis or shortness of breath.   Iran Planas, PA-C

## 2022-04-04 NOTE — Patient Instructions (Signed)
  Acute Bronchitis, Adult  Acute bronchitis is when air tubes in the lungs (bronchi) suddenly get swollen. The condition can make it hard for you to breathe. In adults, acute bronchitis usually goes away within 2 weeks. A cough caused by bronchitis may last up to 3 weeks. Smoking, allergies, and asthma can make the condition worse. What are the causes? Germs that cause cold and flu (viruses). The most common cause of this condition is the virus that causes the common cold. Bacteria. Substances that bother (irritate) the lungs, including: Smoke from cigarettes and other types of tobacco. Dust and pollen. Fumes from chemicals, gases, or burned fuel. Indoor or outdoor air pollution. What increases the risk? A weak body's defense system. This is also called the immune system. Any condition that affects your lungs and breathing, such as asthma. What are the signs or symptoms? A cough. Coughing up clear, yellow, or green mucus. Making high-pitched whistling sounds when you breathe, most often when you breathe out (wheezing). Runny or stuffy nose. Having too much mucus in your lungs (chest congestion). Shortness of breath. Body aches. A sore throat. How is this treated? Acute bronchitis may go away over time without treatment. Your doctor may tell you to: Drink more fluids. This will help thin your mucus so it is easier to cough up. Use a device that gets medicine into your lungs (inhaler). Use a vaporizer or a humidifier. These are machines that add water to the air. This helps with coughing and poor breathing. Take a medicine that thins mucus and helps clear it from your lungs. Take a medicine that prevents or stops coughing. It is not common to take an antibiotic medicine for this condition. Follow these instructions at home:  Take over-the-counter and prescription medicines only as told by your doctor. Use an inhaler, vaporizer, or humidifier as told by your doctor. Take two  teaspoons (10 mL) of honey at bedtime. This helps lessen your coughing at night. Drink enough fluid to keep your pee (urine) pale yellow. Do not smoke or use any products that contain nicotine or tobacco. If you need help quitting, ask your doctor. Get a lot of rest. Return to your normal activities when your doctor says that it is safe. Keep all follow-up visits. How is this prevented?  Wash your hands often with soap and water for at least 20 seconds. If you cannot use soap and water, use hand sanitizer. Avoid contact with people who have cold symptoms. Try not to touch your mouth, nose, or eyes with your hands. Avoid breathing in smoke or chemical fumes. Make sure to get the flu shot every year. Contact a doctor if: Your symptoms do not get better in 2 weeks. You have trouble coughing up the mucus. Your cough keeps you awake at night. You have a fever. Get help right away if: You cough up blood. You have chest pain. You have very bad shortness of breath. You faint or keep feeling like you are going to faint. You have a very bad headache. Your fever or chills get worse. These symptoms may be an emergency. Get help right away. Call your local emergency services (911 in the [place] Do not wait to see if the symptoms will go away. Do not drive yourself to the hospital. Summary Acute bronchitis is when air tubes in the lungs (bronchi) suddenly get swollen. In adults, acute bronchitis usually goes away within 2 weeks. Drink more fluids. This will help thin your mucus so it   is easier to cough up. Take over-the-counter and prescription medicines only as told by your doctor. Contact a doctor if your symptoms do not improve after 2 weeks of treatment. This information is not intended to replace advice given to you by your health care provider. Make sure you discuss any questions you have with your health care provider. Document Revised: 06/30/2020 Document Reviewed: 06/30/2020 Elsevier  Patient Education  2023 Elsevier Inc.  

## 2022-04-21 ENCOUNTER — Ambulatory Visit: Payer: 59 | Admitting: Medical-Surgical

## 2022-08-01 ENCOUNTER — Other Ambulatory Visit: Payer: Self-pay | Admitting: Physician Assistant

## 2022-08-01 DIAGNOSIS — Z8673 Personal history of transient ischemic attack (TIA), and cerebral infarction without residual deficits: Secondary | ICD-10-CM

## 2022-08-01 DIAGNOSIS — G8929 Other chronic pain: Secondary | ICD-10-CM

## 2022-08-03 ENCOUNTER — Other Ambulatory Visit (HOSPITAL_COMMUNITY): Payer: Self-pay

## 2022-08-03 MED ORDER — CELECOXIB 200 MG PO CAPS
200.0000 mg | ORAL_CAPSULE | Freq: Two times a day (BID) | ORAL | 1 refills | Status: DC
  Filled 2022-08-03: qty 60, 30d supply, fill #0
  Filled 2022-10-23: qty 60, 30d supply, fill #1
  Filled 2022-11-28: qty 60, 30d supply, fill #2
  Filled 2023-01-06: qty 60, 30d supply, fill #3
  Filled 2023-02-10: qty 60, 30d supply, fill #4
  Filled 2023-03-20: qty 60, 30d supply, fill #5

## 2022-08-03 MED ORDER — CLOPIDOGREL BISULFATE 75 MG PO TABS
75.0000 mg | ORAL_TABLET | Freq: Every day | ORAL | 3 refills | Status: DC
  Filled 2022-08-03: qty 30, 30d supply, fill #0
  Filled 2022-09-14: qty 30, 30d supply, fill #1
  Filled 2022-10-23: qty 30, 30d supply, fill #2
  Filled 2022-11-28: qty 30, 30d supply, fill #3
  Filled 2023-01-06: qty 30, 30d supply, fill #4
  Filled 2023-02-09: qty 30, 30d supply, fill #5
  Filled 2023-03-20: qty 30, 30d supply, fill #6
  Filled 2023-05-01: qty 30, 30d supply, fill #7
  Filled 2023-06-07: qty 30, 30d supply, fill #8
  Filled 2023-07-15: qty 30, 30d supply, fill #9

## 2022-08-03 MED ORDER — ROSUVASTATIN CALCIUM 10 MG PO TABS
10.0000 mg | ORAL_TABLET | Freq: Every day | ORAL | 1 refills | Status: DC
  Filled 2022-08-03: qty 30, 30d supply, fill #0
  Filled 2022-09-14: qty 30, 30d supply, fill #1
  Filled 2022-10-23: qty 30, 30d supply, fill #2
  Filled 2022-11-28: qty 30, 30d supply, fill #3
  Filled 2023-01-06: qty 30, 30d supply, fill #4
  Filled 2023-02-09: qty 30, 30d supply, fill #5

## 2022-08-05 ENCOUNTER — Other Ambulatory Visit (HOSPITAL_COMMUNITY): Payer: Self-pay

## 2022-08-29 ENCOUNTER — Ambulatory Visit (INDEPENDENT_AMBULATORY_CARE_PROVIDER_SITE_OTHER): Payer: BC Managed Care – PPO | Admitting: Physician Assistant

## 2022-08-29 ENCOUNTER — Other Ambulatory Visit (HOSPITAL_COMMUNITY): Payer: Self-pay

## 2022-08-29 ENCOUNTER — Encounter: Payer: Self-pay | Admitting: Physician Assistant

## 2022-08-29 VITALS — BP 173/98 | HR 64 | Ht 67.0 in | Wt 205.0 lb

## 2022-08-29 DIAGNOSIS — Z79899 Other long term (current) drug therapy: Secondary | ICD-10-CM | POA: Diagnosis not present

## 2022-08-29 DIAGNOSIS — I10 Essential (primary) hypertension: Secondary | ICD-10-CM | POA: Diagnosis not present

## 2022-08-29 MED ORDER — LOSARTAN POTASSIUM-HCTZ 50-12.5 MG PO TABS
1.0000 | ORAL_TABLET | Freq: Every day | ORAL | 1 refills | Status: DC
  Filled 2022-08-29: qty 30, 30d supply, fill #0

## 2022-08-29 NOTE — Progress Notes (Signed)
Established Patient Office Visit  Subjective   Patient ID: Zachary Massey, male    DOB: [birth year]  Age: 57 y.o. MRN: [id number]  Chief Complaint  Patient presents with   Follow-up    BP check    HPI Pt is a 57 yo male who presents to the clinic to follow up on HTN. 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 He denies any CP, palpitations, headaches ,vision changes or dizziness. He has never been on medication.   .. Active Ambulatory Problems    Diagnosis Date Noted   History of anemia 12/01/2011   History of TIA (transient ischemic attack) 12/01/2011   GERD (gastroesophageal reflux disease) 12/03/2011   Ichthyosis 04/09/2012   Erectile dysfunction 07/13/2013   Hypogonadism male 07/13/2013   Generalized anxiety disorder 07/13/2013   Premature ejaculation 07/13/2013   Memory loss 02/03/2015   Ganglion cyst of wrist, left 02/25/2016   Vitamin D deficiency 06/23/2016   Hyperlipidemia 06/23/2016   Screening for diabetes mellitus 06/23/2016   Fatigue 06/23/2016   Health care maintenance 06/23/2016   Screening for colon cancer 08/15/2017   Oral pain 07/17/2018   Encounter for examination required by Department of Transportation (DOT) 09/04/2018   Hypertriglyceridemia 09/30/2018   History of stroke 10/01/2018   Posttraumatic osteoarthritis of right third PIP 05/16/2019   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   Elevated blood pressure reading 12/12/2019   Chronic pain of right knee 12/12/2019   Left elbow pain 12/12/2019   Colon polyp 01/07/2020   Septic olecranon bursitis, right 06/08/2020   Trigger finger, left middle finger 06/08/2020   Low testosterone 12/06/2020   Primary hypertension 11/01/2021   Uncontrolled hypertension 08/29/2022   Resolved Ambulatory Problems    Diagnosis Date Noted   MCI (mild cognitive impairment) 02/03/2015   Bronchitis, acute 12/28/2016   Past Medical History:  Diagnosis Date   Anxiety    Depression    Stroke (HCC)  03/13/2006   TIA (transient ischemic attack)       Review of Systems  All other systems reviewed and are negative.     Objective:     BP (!) 173/98 (BP Location: Left Arm, Patient Position: Sitting, Cuff Size: Normal)   Pulse 64   Ht 5\' 7"  (1.702 m)   Wt 205 lb (93 kg)   SpO2 97%   BMI 32.11 kg/m  BP Readings from Last 3 Encounters:  08/29/22 (!) 173/98  04/04/22 (!) 168/99  01/02/22 (!) 160/102   Wt Readings from Last 3 Encounters:  08/29/22 205 lb (93 kg)  04/04/22 203 lb 0.6 oz (92.1 kg)  01/02/22 197 lb (89.4 kg)      Physical Exam Constitutional:      Appearance: Normal appearance. He is obese.  HENT:     Head: Normocephalic.  Neck:     Vascular: No carotid bruit.  Cardiovascular:     Rate and Rhythm: Normal rate and regular rhythm.     Pulses: Normal pulses.     Heart sounds: Normal heart sounds.  Pulmonary:     Effort: Pulmonary effort is normal.     Breath sounds: Normal breath sounds.  Musculoskeletal:     Cervical back: Normal range of motion and neck supple.     Right lower leg: No edema.     Left lower leg: No edema.  Neurological:     General: No focal deficit present.     Mental Status: He is alert and  oriented to person, place, and time.  Psychiatric:        Mood and Affect: Mood normal.        Assessment & Plan:  Marland KitchenMarland KitchenTymon was seen today for follow-up.  Diagnoses and all orders for this visit:  Uncontrolled hypertension -     losartan-hydrochlorothiazide (HYZAAR) 50-12.5 MG tablet; Take 1 tablet by mouth daily. -     BASIC METABOLIC PANEL WITH GFR; Future  Primary hypertension -     losartan-hydrochlorothiazide (HYZAAR) 50-12.5 MG tablet; Take 1 tablet by mouth daily. -     BASIC METABOLIC PANEL WITH GFR; Future  Medication management -     BASIC METABOLIC PANEL WITH GFR; Future   BP not to goal Discussed DASH diet Encouraged 150 minutes exercise weekly Start losartan/hydrochlorothiazide Discussed SE Follow up nurse visit  in 1 month and check BMP Goal BP under 130/80   Return in about 4 weeks (around 09/26/2022) for nurse visit BP check.    Tandy Gaw, PA-C

## 2022-08-29 NOTE — Patient Instructions (Signed)

## 2022-09-14 ENCOUNTER — Other Ambulatory Visit: Payer: Self-pay | Admitting: Physician Assistant

## 2022-09-14 DIAGNOSIS — N529 Male erectile dysfunction, unspecified: Secondary | ICD-10-CM

## 2022-09-15 ENCOUNTER — Other Ambulatory Visit (HOSPITAL_COMMUNITY): Payer: Self-pay

## 2022-09-15 MED ORDER — TADALAFIL 20 MG PO TABS
20.0000 mg | ORAL_TABLET | Freq: Every day | ORAL | 1 refills | Status: AC | PRN
  Filled 2022-09-15: qty 18, 18d supply, fill #0

## 2022-09-26 ENCOUNTER — Ambulatory Visit (INDEPENDENT_AMBULATORY_CARE_PROVIDER_SITE_OTHER): Payer: BC Managed Care – PPO | Admitting: Physician Assistant

## 2022-09-26 VITALS — BP 140/83 | HR 76 | Temp 98.6°F | Ht 68.0 in | Wt 208.0 lb

## 2022-09-26 DIAGNOSIS — I10 Essential (primary) hypertension: Secondary | ICD-10-CM

## 2022-09-26 NOTE — Progress Notes (Signed)
Patient is here for blood pressure check. Denies trouble sleeping, palpitations, dizziness, lightheadedness, blurry vision, chest pain, shortness of breath, headaches and/or medication problems.   Patient's blood pressure was not within goal range. Patient sat for 15 minutes. Blood pressure recheck was not at goal. Provider notified of current blood pressure readings. Patient does not have a portable machine at home. Advised the patient to stop by any pharmacy and have his blood pressure check with their portable machine when able to do so. Increase hyzaar to 100/12.5mg  daily. Patient agreeable with recommendation. Patient requested a rx for  Zoloft medication. Rx is a new medication. Please send to [facility] pharmacy.  Patient informed to schedule next appointment within 3 months to follow up on medication/bp .

## 2022-09-27 ENCOUNTER — Encounter: Payer: Self-pay | Admitting: Physician Assistant

## 2022-09-27 ENCOUNTER — Other Ambulatory Visit (HOSPITAL_COMMUNITY): Payer: Self-pay

## 2022-09-27 MED ORDER — LOSARTAN POTASSIUM-HCTZ 100-12.5 MG PO TABS
1.0000 | ORAL_TABLET | Freq: Every day | ORAL | 0 refills | Status: DC
  Filled 2022-09-27: qty 30, 30d supply, fill #0

## 2022-09-27 NOTE — Progress Notes (Signed)
BP much better than when started. Increase hyzaar to 100/12.5mg  gave 90 day supply. Follow up in 3 months.

## 2022-09-29 ENCOUNTER — Other Ambulatory Visit (HOSPITAL_COMMUNITY): Payer: Self-pay

## 2022-10-02 ENCOUNTER — Other Ambulatory Visit (HOSPITAL_COMMUNITY): Payer: Self-pay

## 2022-10-02 ENCOUNTER — Other Ambulatory Visit: Payer: Self-pay | Admitting: Physician Assistant

## 2022-10-02 DIAGNOSIS — N529 Male erectile dysfunction, unspecified: Secondary | ICD-10-CM

## 2022-10-09 ENCOUNTER — Other Ambulatory Visit: Payer: Self-pay | Admitting: Physician Assistant

## 2022-10-09 ENCOUNTER — Other Ambulatory Visit (HOSPITAL_COMMUNITY): Payer: Self-pay

## 2022-10-09 DIAGNOSIS — N529 Male erectile dysfunction, unspecified: Secondary | ICD-10-CM

## 2022-10-09 MED ORDER — SERTRALINE HCL 100 MG PO TABS
100.0000 mg | ORAL_TABLET | Freq: Every day | ORAL | 1 refills | Status: DC
  Filled 2022-10-09: qty 30, 30d supply, fill #0
  Filled 2022-11-28: qty 30, 30d supply, fill #1
  Filled 2023-01-06: qty 30, 30d supply, fill #2
  Filled 2023-02-09: qty 30, 30d supply, fill #3
  Filled 2023-03-20: qty 30, 30d supply, fill #4
  Filled 2023-05-01: qty 30, 30d supply, fill #5

## 2022-11-06 ENCOUNTER — Ambulatory Visit (INDEPENDENT_AMBULATORY_CARE_PROVIDER_SITE_OTHER): Payer: BC Managed Care – PPO | Admitting: Physician Assistant

## 2022-11-06 ENCOUNTER — Encounter: Payer: Self-pay | Admitting: Physician Assistant

## 2022-11-06 ENCOUNTER — Other Ambulatory Visit (HOSPITAL_COMMUNITY): Payer: Self-pay

## 2022-11-06 VITALS — BP 130/86 | HR 89 | Ht 68.0 in | Wt 204.0 lb

## 2022-11-06 DIAGNOSIS — I10 Essential (primary) hypertension: Secondary | ICD-10-CM | POA: Diagnosis not present

## 2022-11-06 MED ORDER — LOSARTAN POTASSIUM-HCTZ 100-25 MG PO TABS
1.0000 | ORAL_TABLET | Freq: Every day | ORAL | 1 refills | Status: DC
Start: 2022-11-06 — End: 2023-06-07
  Filled 2022-11-06: qty 30, 30d supply, fill #0
  Filled 2022-12-12: qty 30, 30d supply, fill #1
  Filled 2023-01-18: qty 30, 30d supply, fill #2
  Filled 2023-02-21: qty 30, 30d supply, fill #3
  Filled 2023-04-03: qty 30, 30d supply, fill #4
  Filled 2023-05-16: qty 30, 30d supply, fill #5

## 2022-11-06 NOTE — Progress Notes (Signed)
Established Patient Office Visit  Subjective   Patient ID: Zachary Massey, male    DOB: 1965-03-16  Age: 57 y.o. MRN: [id number]  Chief Complaint  Patient presents with   Hypertension    HPI Pt is a 57 yo male with HTN who presents to the clinic to follow up on BP. 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 He was started on hyzaar. He has had no complaints. Denies any CP, palpitations, headaches or vision changes. He is not checking BP at home.   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   Elevated blood pressure reading 12/12/2019   Chronic pain of right knee 12/12/2019   Left elbow pain 12/12/2019   Posttraumatic osteoarthritis of right third PIP 05/16/2019   History of stroke 10/01/2018   Hypertriglyceridemia 09/30/2018   Encounter for examination required by Department of Transportation (DOT) 09/04/2018   Oral pain 07/17/2018   Screening for colon cancer 08/15/2017   Vitamin D deficiency 06/23/2016   Hyperlipidemia 06/23/2016   Screening for diabetes mellitus 06/23/2016   Fatigue 06/23/2016   Health care maintenance 06/23/2016   Ganglion cyst of wrist, left 02/25/2016   Memory loss 02/03/2015   Erectile dysfunction 07/13/2013   Hypogonadism male 07/13/2013   Generalized anxiety disorder 07/13/2013   Premature ejaculation 07/13/2013   Ichthyosis 04/09/2012   GERD (gastroesophageal reflux disease) 12/03/2011   History of anemia 12/01/2011   History of TIA (transient ischemic attack) 12/01/2011   Past Medical History:  Diagnosis Date   Anxiety    Depression    Erectile dysfunction    GERD (gastroesophageal reflux disease)    GERD (gastroesophageal reflux disease)    Hypogonadism  male    Premature ejaculation    Stroke (HCC) 03/13/2006   TIA (aphasia; admitted; Plavix therapy; maintained on Plavix.   TIA (transient ischemic attack)    Past Surgical History:  Procedure Laterality Date   VASECTOMY     Family History  Problem Relation Age of Onset   Hyperlipidemia Mother    Cancer Paternal Grandfather        stomach, esophageal   Heart attack Maternal Grandmother    Stroke Paternal Grandmother    Dementia Paternal Grandmother    No Known Allergies    ROS See HPI.    Objective:     BP 130/86   Pulse 89   Ht 5\' 8"  (1.727 m)   Wt 204 lb (92.5 kg)   SpO2 98%   BMI 31.02 kg/m  BP Readings from Last 3 Encounters:  11/06/22 130/86  09/26/22 (!) 140/83  08/29/22 (!) 173/98   Wt Readings from Last 3 Encounters:  11/06/22 204 lb (92.5 kg)  09/26/22 208 lb (94.3 kg)  08/29/22 205 lb (93 kg)      Physical Exam Constitutional:      Appearance: Normal appearance. He is obese.  HENT:     Head: Normocephalic.  Cardiovascular:     Rate and Rhythm: Normal rate and regular rhythm.     Pulses: Normal pulses.     Heart sounds: Normal heart sounds.  Pulmonary:     Effort: Pulmonary effort is normal.     Breath sounds: Normal breath sounds.  Musculoskeletal:     Right lower leg: No edema.     Left lower leg: No edema.  Neurological:     General: No focal deficit present.     Mental Status: He is alert and oriented to person, place, and time.  Psychiatric:        Mood and Affect: Mood normal.        Assessment & Plan:  Marland KitchenMarland KitchenRaef was seen today for hypertension.  Diagnoses and all orders for this visit:  Primary hypertension -     CMP14+EGFR -     losartan-hydrochlorothiazide (HYZAAR) 100-25 MG tablet; Take 1 tablet by mouth daily.  On 2nd recheck BP is under 140/90 and paperwork signed Ok to schedule DOT BP is borderline and increased hyzaar to 100/25mg  daily CMP ordered Continue with low salt diet and regular exercise    Return in  about 6 months (around 05/09/2023).    Tandy Gaw, PA-C

## 2022-11-24 ENCOUNTER — Other Ambulatory Visit (HOSPITAL_COMMUNITY): Payer: Self-pay

## 2022-11-24 ENCOUNTER — Ambulatory Visit (INDEPENDENT_AMBULATORY_CARE_PROVIDER_SITE_OTHER): Payer: BC Managed Care – PPO | Admitting: Physician Assistant

## 2022-11-24 VITALS — Temp 97.2°F

## 2022-11-24 DIAGNOSIS — Z23 Encounter for immunization: Secondary | ICD-10-CM | POA: Diagnosis not present

## 2022-11-24 NOTE — Progress Notes (Signed)
Pt here for flu shot. Afebrile,no recent illness. Vaccination given, pt tolerated well.

## 2022-11-29 ENCOUNTER — Other Ambulatory Visit (HOSPITAL_COMMUNITY): Payer: Self-pay

## 2023-01-19 ENCOUNTER — Other Ambulatory Visit (HOSPITAL_COMMUNITY): Payer: Self-pay

## 2023-02-09 ENCOUNTER — Other Ambulatory Visit (HOSPITAL_COMMUNITY): Payer: Self-pay

## 2023-02-10 ENCOUNTER — Other Ambulatory Visit (HOSPITAL_COMMUNITY): Payer: Self-pay

## 2023-02-14 ENCOUNTER — Other Ambulatory Visit (HOSPITAL_COMMUNITY): Payer: Self-pay

## 2023-03-20 ENCOUNTER — Other Ambulatory Visit (HOSPITAL_BASED_OUTPATIENT_CLINIC_OR_DEPARTMENT_OTHER): Payer: Self-pay

## 2023-03-20 ENCOUNTER — Other Ambulatory Visit (HOSPITAL_COMMUNITY): Payer: Self-pay

## 2023-03-20 ENCOUNTER — Other Ambulatory Visit: Payer: Self-pay | Admitting: Physician Assistant

## 2023-03-20 DIAGNOSIS — Z8673 Personal history of transient ischemic attack (TIA), and cerebral infarction without residual deficits: Secondary | ICD-10-CM

## 2023-03-20 MED ORDER — ROSUVASTATIN CALCIUM 10 MG PO TABS
10.0000 mg | ORAL_TABLET | Freq: Every day | ORAL | 1 refills | Status: DC
Start: 2023-03-20 — End: 2023-10-22
  Filled 2023-03-20: qty 30, 30d supply, fill #0
  Filled 2023-05-01: qty 30, 30d supply, fill #1
  Filled 2023-06-07: qty 30, 30d supply, fill #2
  Filled 2023-07-15: qty 30, 30d supply, fill #3
  Filled 2023-08-27: qty 30, 30d supply, fill #4
  Filled 2023-10-08: qty 30, 30d supply, fill #5

## 2023-03-23 ENCOUNTER — Other Ambulatory Visit (HOSPITAL_COMMUNITY): Payer: Self-pay

## 2023-04-30 ENCOUNTER — Other Ambulatory Visit (HOSPITAL_COMMUNITY): Payer: Self-pay

## 2023-04-30 MED ORDER — LORAZEPAM 2 MG PO TABS
2.0000 mg | ORAL_TABLET | ORAL | 0 refills | Status: DC
  Filled 2023-04-30: qty 1, 1d supply, fill #0

## 2023-04-30 MED ORDER — AMOXICILLIN 250 MG PO CAPS
250.0000 mg | ORAL_CAPSULE | Freq: Three times a day (TID) | ORAL | 0 refills | Status: DC
  Filled 2023-04-30: qty 15, 5d supply, fill #0

## 2023-04-30 MED ORDER — HYDROCODONE-ACETAMINOPHEN 5-325 MG PO TABS
1.0000 | ORAL_TABLET | Freq: Four times a day (QID) | ORAL | 0 refills | Status: DC | PRN
  Filled 2023-04-30: qty 8, 2d supply, fill #0

## 2023-04-30 MED ORDER — ONDANSETRON 4 MG PO TBDP
4.0000 mg | ORAL_TABLET | ORAL | 0 refills | Status: DC
  Filled 2023-04-30: qty 6, 4d supply, fill #0

## 2023-05-01 ENCOUNTER — Other Ambulatory Visit (HOSPITAL_COMMUNITY): Payer: Self-pay

## 2023-05-01 ENCOUNTER — Other Ambulatory Visit: Payer: Self-pay | Admitting: Physician Assistant

## 2023-05-01 DIAGNOSIS — G8929 Other chronic pain: Secondary | ICD-10-CM

## 2023-05-04 ENCOUNTER — Other Ambulatory Visit: Payer: Self-pay | Admitting: Physician Assistant

## 2023-05-04 ENCOUNTER — Other Ambulatory Visit (HOSPITAL_COMMUNITY): Payer: Self-pay

## 2023-05-04 DIAGNOSIS — G8929 Other chronic pain: Secondary | ICD-10-CM

## 2023-05-09 ENCOUNTER — Ambulatory Visit: Payer: BC Managed Care – PPO | Admitting: Physician Assistant

## 2023-05-10 ENCOUNTER — Other Ambulatory Visit (HOSPITAL_COMMUNITY): Payer: Self-pay

## 2023-05-10 MED ORDER — CELECOXIB 200 MG PO CAPS
200.0000 mg | ORAL_CAPSULE | Freq: Two times a day (BID) | ORAL | 1 refills | Status: DC
Start: 2023-05-10 — End: 2023-10-22
  Filled 2023-05-10: qty 60, 30d supply, fill #0
  Filled 2023-06-25: qty 60, 30d supply, fill #1
  Filled 2023-08-02: qty 60, 30d supply, fill #2
  Filled 2023-09-04: qty 60, 30d supply, fill #3

## 2023-06-07 ENCOUNTER — Other Ambulatory Visit (HOSPITAL_COMMUNITY): Payer: Self-pay

## 2023-06-07 ENCOUNTER — Other Ambulatory Visit: Payer: Self-pay | Admitting: Physician Assistant

## 2023-06-07 DIAGNOSIS — I10 Essential (primary) hypertension: Secondary | ICD-10-CM

## 2023-06-07 DIAGNOSIS — N529 Male erectile dysfunction, unspecified: Secondary | ICD-10-CM

## 2023-06-07 MED ORDER — SERTRALINE HCL 100 MG PO TABS
100.0000 mg | ORAL_TABLET | Freq: Every day | ORAL | 1 refills | Status: DC
  Filled 2023-06-07: qty 30, 30d supply, fill #0
  Filled 2023-07-15: qty 30, 30d supply, fill #1
  Filled 2023-08-27: qty 30, 30d supply, fill #2
  Filled 2023-10-08: qty 30, 30d supply, fill #3

## 2023-06-07 MED ORDER — LOSARTAN POTASSIUM-HCTZ 100-25 MG PO TABS
1.0000 | ORAL_TABLET | Freq: Every day | ORAL | 1 refills | Status: DC
Start: 2023-06-07 — End: 2023-10-22
  Filled 2023-06-07 – 2023-06-08 (×2): qty 30, 30d supply, fill #0
  Filled 2023-07-15: qty 30, 30d supply, fill #1
  Filled 2023-08-27: qty 30, 30d supply, fill #2
  Filled 2023-10-08: qty 30, 30d supply, fill #3

## 2023-06-08 ENCOUNTER — Other Ambulatory Visit (HOSPITAL_COMMUNITY): Payer: Self-pay

## 2023-07-07 ENCOUNTER — Other Ambulatory Visit: Payer: Self-pay | Admitting: Family Medicine

## 2023-07-15 ENCOUNTER — Other Ambulatory Visit: Payer: Self-pay | Admitting: Family Medicine

## 2023-07-23 ENCOUNTER — Other Ambulatory Visit (HOSPITAL_COMMUNITY): Payer: Self-pay

## 2023-08-27 ENCOUNTER — Other Ambulatory Visit: Payer: Self-pay | Admitting: Physician Assistant

## 2023-08-27 DIAGNOSIS — Z8673 Personal history of transient ischemic attack (TIA), and cerebral infarction without residual deficits: Secondary | ICD-10-CM

## 2023-08-28 ENCOUNTER — Other Ambulatory Visit (HOSPITAL_COMMUNITY): Payer: Self-pay

## 2023-08-28 MED ORDER — CLOPIDOGREL BISULFATE 75 MG PO TABS
75.0000 mg | ORAL_TABLET | Freq: Every day | ORAL | 0 refills | Status: DC
Start: 2023-08-28 — End: 2023-10-22
  Filled 2023-08-28: qty 30, 30d supply, fill #0
  Filled 2023-10-08: qty 30, 30d supply, fill #1

## 2023-09-07 ENCOUNTER — Other Ambulatory Visit: Payer: Self-pay | Admitting: Family Medicine

## 2023-09-07 ENCOUNTER — Telehealth: Payer: Self-pay | Admitting: Family Medicine

## 2023-09-07 NOTE — Telephone Encounter (Signed)
 Is call patient and get him scheduled.  He is very overdue for 16-month follow-up.  He is probably getting along his medications and we need to get him back in.

## 2023-09-24 ENCOUNTER — Ambulatory Visit: Admitting: Physician Assistant

## 2023-10-22 ENCOUNTER — Ambulatory Visit (INDEPENDENT_AMBULATORY_CARE_PROVIDER_SITE_OTHER): Admitting: Physician Assistant

## 2023-10-22 ENCOUNTER — Other Ambulatory Visit (HOSPITAL_COMMUNITY): Payer: Self-pay

## 2023-10-22 ENCOUNTER — Encounter: Payer: Self-pay | Admitting: Physician Assistant

## 2023-10-22 VITALS — BP 130/70 | HR 60 | Ht 68.0 in | Wt 210.0 lb

## 2023-10-22 DIAGNOSIS — F411 Generalized anxiety disorder: Secondary | ICD-10-CM | POA: Insufficient documentation

## 2023-10-22 DIAGNOSIS — Z8673 Personal history of transient ischemic attack (TIA), and cerebral infarction without residual deficits: Secondary | ICD-10-CM

## 2023-10-22 DIAGNOSIS — I10 Essential (primary) hypertension: Secondary | ICD-10-CM | POA: Diagnosis not present

## 2023-10-22 DIAGNOSIS — E782 Mixed hyperlipidemia: Secondary | ICD-10-CM

## 2023-10-22 DIAGNOSIS — M25521 Pain in right elbow: Secondary | ICD-10-CM | POA: Diagnosis not present

## 2023-10-22 DIAGNOSIS — E291 Testicular hypofunction: Secondary | ICD-10-CM | POA: Diagnosis not present

## 2023-10-22 DIAGNOSIS — N529 Male erectile dysfunction, unspecified: Secondary | ICD-10-CM

## 2023-10-22 DIAGNOSIS — M25522 Pain in left elbow: Secondary | ICD-10-CM

## 2023-10-22 MED ORDER — CLOPIDOGREL BISULFATE 75 MG PO TABS
75.0000 mg | ORAL_TABLET | Freq: Every day | ORAL | 3 refills | Status: AC
  Filled 2023-10-22 – 2023-11-15 (×2): qty 30, 30d supply, fill #0
  Filled 2023-12-20: qty 30, 30d supply, fill #1
  Filled 2024-01-23: qty 30, 30d supply, fill #2
  Filled 2024-03-04: qty 30, 30d supply, fill #3

## 2023-10-22 MED ORDER — TESTOSTERONE CYPIONATE 200 MG/ML IM SOLN
200.0000 mg | INTRAMUSCULAR | 1 refills | Status: AC
  Filled 2023-10-22: qty 2, 28d supply, fill #0

## 2023-10-22 MED ORDER — ROSUVASTATIN CALCIUM 10 MG PO TABS
10.0000 mg | ORAL_TABLET | Freq: Every day | ORAL | 1 refills | Status: AC
Start: 2023-10-22 — End: ?
  Filled 2023-10-22: qty 90, 90d supply, fill #0
  Filled 2023-11-15: qty 30, 30d supply, fill #0
  Filled 2023-12-20: qty 30, 30d supply, fill #1
  Filled 2024-01-23: qty 30, 30d supply, fill #2
  Filled 2024-03-04: qty 30, 30d supply, fill #3

## 2023-10-22 MED ORDER — SERTRALINE HCL 100 MG PO TABS
100.0000 mg | ORAL_TABLET | Freq: Every day | ORAL | 1 refills | Status: AC
  Filled 2023-10-22: qty 90, 90d supply, fill #0
  Filled 2023-11-15: qty 30, 30d supply, fill #0
  Filled 2023-12-20: qty 30, 30d supply, fill #1
  Filled 2024-01-23: qty 30, 30d supply, fill #2
  Filled 2024-03-04: qty 30, 30d supply, fill #3

## 2023-10-22 MED ORDER — CELECOXIB 200 MG PO CAPS
200.0000 mg | ORAL_CAPSULE | Freq: Two times a day (BID) | ORAL | 1 refills | Status: AC
  Filled 2023-10-22: qty 60, 30d supply, fill #0
  Filled 2023-12-01 – 2023-12-21 (×2): qty 60, 30d supply, fill #1
  Filled 2024-01-23: qty 60, 30d supply, fill #2
  Filled 2024-03-04: qty 60, 30d supply, fill #3

## 2023-10-22 MED ORDER — LOSARTAN POTASSIUM-HCTZ 100-25 MG PO TABS
1.0000 | ORAL_TABLET | Freq: Every day | ORAL | 1 refills | Status: AC
Start: 2023-10-22 — End: ?
  Filled 2023-10-22: qty 90, 90d supply, fill #0
  Filled 2023-11-15: qty 30, 30d supply, fill #0
  Filled 2023-12-20: qty 30, 30d supply, fill #1
  Filled 2024-01-23: qty 30, 30d supply, fill #2
  Filled 2024-03-04: qty 30, 30d supply, fill #3

## 2023-10-22 NOTE — Progress Notes (Signed)
 Established Patient Office Visit  Subjective   Patient ID: Zachary Massey, male    DOB: [birth year]  Age: 58 y.o. MRN: [id number]  Chief Complaint  Patient presents with   Medical Management of Chronic Issues    HPI Pt is a 58 yo male with HTN, GAD, HLD, Hx of TIA, hypogondism who presents to the clinic for mediation refills.   Overall patient feels good. His mood and energy is good. He is sleeping well. He is taking most of his medication but has not been consistent with testosterone  shots. He denies any CP, palpitations, headaches or vision changes.   He continues to have bilateral elbow pain. He does not feel like tennis elbow braces help. He has to lift at work and at times it is hard to lift due to pain. This has been going on for quite sometime.    ROS See HPI.    Objective:     BP 130/70   Pulse 60   Ht 5' 8 (1.727 m)   Wt 210 lb (95.3 kg)   SpO2 99%   BMI 31.93 kg/m  BP Readings from Last 3 Encounters:  10/22/23 130/70  11/06/22 130/86  09/26/22 (!) 140/83   Wt Readings from Last 3 Encounters:  10/22/23 210 lb (95.3 kg)  11/06/22 204 lb (92.5 kg)  09/26/22 208 lb (94.3 kg)    ..    10/23/2023    6:07 AM 08/29/2022    9:25 AM 04/04/2022    8:22 AM 01/02/2022    1:28 PM 11/01/2021   11:25 AM  Depression screen PHQ 2/9  Decreased Interest 0 0 0 0 1  Down, Depressed, Hopeless 0 0 0 0 1  PHQ - 2 Score 0 0 0 0 2  Altered sleeping 1    0  Tired, decreased energy 1      Change in appetite 0    0  Feeling bad or failure about yourself  0    1  Trouble concentrating 0    3  Moving slowly or fidgety/restless 0    0  Suicidal thoughts 0    0  PHQ-9 Score 2    6  Difficult doing work/chores Not difficult at all    Somewhat difficult   ..    10/23/2023    6:07 AM 11/01/2021   11:25 AM 12/06/2020    3:59 PM 07/26/2020    1:38 PM  GAD 7 : Generalized Anxiety Score  Nervous, Anxious, on Edge 1 1 0 0  Control/stop worrying 0 1 0 0  Worry too much - different  things 1 1 0 0  Trouble relaxing 0 1 0 0  Restless 0 1 0 0  Easily annoyed or irritable 1 1 1  0  Afraid - awful might happen 0 1 0 0  Total GAD 7 Score 3 7 1  0  Anxiety Difficulty Not difficult at all Somewhat difficult Somewhat difficult Not difficult at all      Physical Exam Constitutional:      Appearance: Normal appearance. He is obese.  HENT:     Head: Normocephalic.  Neck:     Vascular: No carotid bruit.  Cardiovascular:     Rate and Rhythm: Normal rate and regular rhythm.  Pulmonary:     Effort: Pulmonary effort is normal.     Breath sounds: Normal breath sounds.  Musculoskeletal:     Cervical back: Normal range of motion and neck supple. No tenderness.  Right lower leg: No edema.     Left lower leg: No edema.     Comments: Bilateral tenderness over lateral epicondyle. NROM of elbow. No significant swelling, warmth or redness.   Lymphadenopathy:     Cervical: No cervical adenopathy.  Neurological:     Mental Status: He is alert.  Psychiatric:        Mood and Affect: Mood normal.       Assessment & Plan:  Zachary Massey was seen today for medical management of chronic issues.  Diagnoses and all orders for this visit:  Primary hypertension -     losartan -hydrochlorothiazide  (HYZAAR ) 100-25 MG tablet; Take 1 tablet by mouth daily. -     CMP14+EGFR  Bilateral elbow joint pain -     celecoxib  (CELEBREX ) 200 MG capsule; Take 1 capsule (200 mg total) by mouth 2 (two) times daily.  Erectile dysfunction, unspecified erectile dysfunction type  H/O TIA (transient ischemic attack) and stroke -     clopidogrel  (PLAVIX ) 75 MG tablet; Take 1 tablet (75 mg total) by mouth daily. -     rosuvastatin  (CRESTOR ) 10 MG tablet; Take 1 tablet (10 mg total) by mouth daily.  Mixed hyperlipidemia -     Lipid panel  Male hypogonadism -     testosterone  cypionate (DEPOTESTOSTERONE CYPIONATE) 200 MG/ML injection; Inject 1 mL (200 mg total) into the muscle every 14 (fourteen) days. -      CMP14+EGFR -     Testosterone  -     PSA -     CBC w/Diff/Platelet  GAD (generalized anxiety disorder) -     sertraline  (ZOLOFT ) 100 MG tablet; Take 1 tablet (100 mg total) by mouth daily.   Pt declined all vaccines today He has not been taking testosterone  regular, refilled for 2 months today to start taking and then have labs checked so we see accurately levels on this current dose PSA/lipid panel/CMP/CBC ordered as well to have done fasting BP to goal today, refilled Hyzaar  PHQ/GAD to goal, refilled zoloft  Crestor  and plavix  refilled, fasting labs to be done in next 2 months Refilled celebrex  for bilateral elbow pain Failed tennis elbow braces Follow up with Dr. ONEIDA  Return in about 6 months (around 04/23/2024) for make appt with Dr. ONEIDA for chronic elbow pain.    Zachary Hammitt, PA-C

## 2023-10-22 NOTE — Patient Instructions (Signed)
 Get labs in next month

## 2023-11-15 ENCOUNTER — Other Ambulatory Visit (HOSPITAL_COMMUNITY): Payer: Self-pay

## 2023-12-11 ENCOUNTER — Other Ambulatory Visit (HOSPITAL_COMMUNITY): Payer: Self-pay

## 2023-12-21 ENCOUNTER — Other Ambulatory Visit (HOSPITAL_COMMUNITY): Payer: Self-pay

## 2024-04-04 ENCOUNTER — Other Ambulatory Visit (HOSPITAL_COMMUNITY): Payer: Self-pay

## 2024-04-04 ENCOUNTER — Ambulatory Visit (INDEPENDENT_AMBULATORY_CARE_PROVIDER_SITE_OTHER): Admitting: Physician Assistant

## 2024-04-04 ENCOUNTER — Encounter: Payer: Self-pay | Admitting: Physician Assistant

## 2024-04-04 VITALS — BP 132/76 | HR 71 | Ht 68.0 in | Wt 217.0 lb

## 2024-04-04 DIAGNOSIS — G5702 Lesion of sciatic nerve, left lower limb: Secondary | ICD-10-CM | POA: Insufficient documentation

## 2024-04-04 DIAGNOSIS — M79605 Pain in left leg: Secondary | ICD-10-CM

## 2024-04-04 MED ORDER — PREDNISONE 50 MG PO TABS
ORAL_TABLET | ORAL | 0 refills | Status: AC
  Filled 2024-04-04 (×2): qty 5, 5d supply, fill #0

## 2024-04-04 MED ORDER — CYCLOBENZAPRINE HCL 10 MG PO TABS
10.0000 mg | ORAL_TABLET | Freq: Three times a day (TID) | ORAL | 0 refills | Status: AC | PRN
  Filled 2024-04-04 (×2): qty 30, 10d supply, fill #0

## 2024-04-04 MED ORDER — KETOROLAC TROMETHAMINE 60 MG/2ML IM SOLN
60.0000 mg | Freq: Once | INTRAMUSCULAR | Status: AC
  Administered 2024-04-04: 60 mg via INTRAMUSCULAR

## 2024-04-04 NOTE — Progress Notes (Signed)
 "  Acute Office Visit  Subjective:     Patient ID: Zachary Massey, male    DOB: 1965/04/29, 59 y.o.   MRN: [id number]  Chief Complaint  Patient presents with   Leg Pain    Left leg pain    HPI .Discussed the use of AI scribe software for clinical note transcription with the patient, who gave verbal consent to proceed.  History of Present Illness Zachary Massey is a 59 year old male who presents with left-sided leg pain radiating from the buttocks to the calf.  Left lower extremity pain - Pain present for approximately two weeks - Originates in the left buttocks and radiates down to the left calf - Pain described as catching and particularly severe in the calf area - No associated back pain - No history of trauma, heavy lifting, or twisting injury - Pain is more pronounced in the evenings when he stops moving around - denies any weakness in legs - denies any bowel or bladder changes or numbness and tingling  Pain management - Uses Advil for pain control, which is effective especially when active - No use of Icyhot patches or other topical treatments     ROS See HPI.     Objective:    BP 132/76   Pulse 71   Ht 5' 8 (1.727 m)   Wt 217 lb (98.4 kg)   SpO2 97%   BMI 32.99 kg/m  BP Readings from Last 3 Encounters:  04/04/24 132/76  10/22/23 130/70  11/06/22 130/86   Wt Readings from Last 3 Encounters:  04/04/24 217 lb (98.4 kg)  10/22/23 210 lb (95.3 kg)  11/06/22 204 lb (92.5 kg)      Physical Exam Constitutional:      Appearance: Normal appearance. He is obese.  HENT:     Head: Normocephalic.  Cardiovascular:     Rate and Rhythm: Normal rate.  Pulmonary:     Effort: Pulmonary effort is normal.  Musculoskeletal:     Right lower leg: No edema.     Left lower leg: No edema.     Comments: No pain to palpation over lumbar spine.  5/5 bilateral leg strength.  Discomfort with left SLR and at 90 degrees some radiation into left calf NROM at waist   Neurological:     General: No focal deficit present.     Mental Status: He is alert and oriented to person, place, and time.  Psychiatric:        Mood and Affect: Mood normal.          Assessment & Plan:  .Garlon was seen today for leg pain.  Diagnoses and all orders for this visit:  Sciatic mononeuropathy, left -     predniSONE  (DELTASONE ) 50 MG tablet; Take one tablet by mouth daily for 5 days. -     cyclobenzaprine  (FLEXERIL ) 10 MG tablet; Take 1 tablet (10 mg total) by mouth 3 (three) times daily as needed for muscle spasms. -     ketorolac  (TORADOL ) injection 60 mg  Piriformis syndrome of left side -     predniSONE  (DELTASONE ) 50 MG tablet; Take one tablet by mouth daily for 5 days. -     cyclobenzaprine  (FLEXERIL ) 10 MG tablet; Take 1 tablet (10 mg total) by mouth 3 (three) times daily as needed for muscle spasms. -     ketorolac  (TORADOL ) injection 60 mg   Assessment & Plan Sciatica Left-sided sciatica likely involving the piriformis muscle. Ibuprofen provided partial  relief. - Administered Toradol  injection for immediate pain relief. - Prescribed 5-day course of prednisone . - Prescribed muscle relaxer for nighttime use. - Recommended Icyhot patches with lidocaine applied diagonally from buttocks down the leg. - Advised use of TENS unit and heating pad for additional relief. - Provided exercises to relax piriformis muscle, including self-massage and stretching. - Instructed to avoid ibuprofen while on prednisone ; Tylenol  permitted if needed. - Follow up as needed if symptoms persist or worsen.     Giancarlo Askren, PA-C   "

## 2024-04-04 NOTE — Patient Instructions (Signed)
 Piriformis Syndrome  Piriformis syndrome is a condition that can cause pain and numbness in your buttocks and down the back of your leg. Piriformis syndrome happens when the small muscle that connects the base of your spine to your hip (piriformis muscle) presses on the nerve that runs down the back of your leg (sciatic nerve). The piriformis muscle helps your hip rotate and helps to bring your leg back and out. It also helps shift your weight to keep you stable while you are walking. The sciatic nerve runs under or through the piriformis muscle. Damage to the piriformis muscle can cause spasms that put pressure on the nerve below. This causes pain and discomfort while sitting and moving. The pain may feel as if it begins in the buttock and spreads (radiates) down your hip and thigh. What are the causes? This condition is caused by pressure on the sciatic nerve from the piriformis muscle. The piriformis muscle can get irritated with overuse, especially if other hip muscles are weak and the piriformis muscle has to do extra work. Piriformis syndrome can also occur after an injury, like a fall onto your buttocks. What increases the risk? You are more likely to develop this condition if you: Are a woman. Sit for long periods of time. Are a cyclist. Have weak buttocks muscles (gluteal muscles). What are the signs or symptoms? Symptoms of this condition include: Pain, tingling, or numbness that starts in the buttock and runs down the back of your leg (sciatica). Pain in the groin or thigh area. Your symptoms may get worse: The longer you sit. When you walk, run, or climb stairs. When straining to have a bowel movement. How is this diagnosed? This condition is diagnosed based on your symptoms, medical history, and physical exam. During the exam, your health care provider may: Move your leg into different positions to check for pain. Press on the muscles of your hip and buttock to see if that  increases your symptoms. You may also have tests, including: Imaging tests such as X-rays, CT, MRI, or ultrasound. Electromyogram (EMG). This test measures electrical signals sent by your nerves into the muscles. Nerve conduction study. This test measures how well electrical signals pass through your nerves. How is this treated? This condition may be treated by: Stopping all activities that cause pain or make your condition worse. Applying ice or using heat therapy. Taking medicines to reduce pain and swelling. Taking a muscle relaxer (muscle relaxant) to stop muscle spasms. Doing range-of-motion and strengthening exercises (physical therapy) as told by your health care provider. Having massage, acupuncture, or local electrical stimulation (transcutaneous electrical nerve stimulation, TENS). Getting an injection of medicine in the piriformis muscle. Your health care provider will choose the medicine based on your condition. He or she may inject: An anti-inflammatory medicine (steroid) to reduce swelling. A numbing medicine (local anesthetic) to block the pain. Botulinum toxin. The toxin blocks nerve impulses to specific muscles to reduce muscle tension. In rare cases, you may need surgery to cut the muscle and release pressure on the nerve if other treatments do not work. Follow these instructions at home: Activity Do not sit for long periods. Get up and walk around every 20 minutes or as often as told by your health care provider. When driving long distances, make sure to take frequent stops to get up and stretch. Use a cushion when you sit on hard surfaces. Do exercises as told by your health care provider. Return to your normal activities as  told by your health care provider. Ask your health care provider what activities are safe for you. Managing pain, stiffness, and swelling     If directed, apply heat to the area as often as told by your health care provider. Use the heat source  that your health care provider recommends, such as a moist heat pack or a heating pad. Place a towel between your skin and the heat source. Leave the heat on for 20-30 minutes. Remove the heat if your skin turns bright red. This is especially important if you are unable to feel pain, heat, or cold. You have a greater risk of getting burned. If directed, put ice on the injured area. To do this: Put ice in a plastic bag. Place a towel between your skin and the bag. Leave the ice on for 20 minutes, 2-3 times a day. Remove the ice if your skin turns bright red. This is very important. If you cannot feel pain, heat, or cold, you have a greater risk of damage to the area. General instructions Take over-the-counter and prescription medicines only as told by your health care provider. Ask your health care provider if the medicine prescribed to you requires you to avoid driving or using machinery. You may need to take these actions to prevent or treat constipation: Drink enough fluid to keep your urine pale yellow. Take over-the-counter or prescription medicines. Eat foods that are high in fiber, such as beans, whole grains, and fresh fruits and vegetables. Limit foods that are high in fat and processed sugars, such as fried or sweet foods. Keep all follow-up visits. This is important. How is this prevented? Do not sit for longer than 20 minutes at a time. When you sit, choose padded surfaces. Warm up and stretch before being active. Cool down and stretch after being active. Contact a health care provider if: Your pain and stiffness continue or get worse. Your leg or hip becomes weak. You have changes in your bowel function or bladder function. Summary Piriformis syndrome is a condition that can cause pain, tingling, and numbness in your buttocks and down the back of your leg. You may try applying heat or ice to relieve the pain. Do not sit for long periods. Get up and walk around every 20  minutes or as often as told by your health care provider. This information is not intended to replace advice given to you by your health care provider. Make sure you discuss any questions you have with your health care provider. Document Revised: 08/23/2020 Document Reviewed: 08/23/2020 Elsevier Patient Education  2024 [employer]   Piriformis Syndrome Rehab Ask your health care provider which exercises are safe for you. Do exercises exactly as told by your health care provider and adjust them as directed. It is normal to feel mild stretching, pulling, tightness, or discomfort as you do these exercises. Stop right away if you feel sudden pain or your pain gets worse. Do not begin these exercises until told by your health care provider. Stretching and range-of-motion exercises These exercises warm up your muscles and joints and improve the movement and flexibility of your hip and pelvis. The exercises also help to relieve pain, numbness, and tingling. Nerve root  Sit on a firm surface that is high enough that you can swing your left / right foot freely. Place a folded towel under your left / right thigh. This is optional. Drop your head forward and round your back. While you keep your left / right  foot relaxed, slowly straighten your left / right knee until you feel a slight pull behind your knee or calf. If your leg is fully extended and you still do not feel a pull, slowly tilt your foot and toes toward you. Hold this position for __________ seconds. Slowly return your knee to its starting position. Hip rotation This is an exercise in which you lie on your back and stretch the muscles that rotate your hip (hip rotators) to stretch your buttocks. Lie on your back on a firm surface. Pull your left / right knee toward your same shoulder with your left / right hand until your knee is pointing toward the ceiling. Hold your left / right ankle with your other hand. Keeping your knee steady, gently  pull your left / right ankle toward your other shoulder until you feel a stretch in your buttocks. Hold this position for __________ seconds. Repeat __________ times. Complete this exercise __________ times a day. Hip extensor This is an exercise in which you lie on your back and pull your knee to your chest. Lie on your back on a firm surface. Both of your legs should be straight. Pull your left / right knee to your chest. Hold your leg in this position by holding on to the back of your thigh or the front of your knee. Hold this position for __________ seconds. Slowly return to the starting position. Repeat __________ times. Complete this exercise __________ times a day. Strengthening exercises These exercises build strength and endurance in your hip and thigh muscles. Endurance is the ability to use your muscles for a long time, even after they get tired. Straight leg raises, side-lying This exercise strengthens the muscles that rotate the leg at the hip and move it away from your body (hip abductors). Lie on your side with your left / right leg in the top position. Lie so your head, shoulder, knee, and hip line up. Bend your bottom knee to help you balance. Lift your top leg 4-6 inches (10-15 cm) while keeping your toes pointed straight ahead. Hold this position for __________ seconds. Slowly lower your leg to the starting position. Let your muscles relax completely after each repetition. Repeat __________ times. Complete this exercise __________ times a day. Hip abduction and rotation This is sometimes called quadruped (on hands and knees) exercises. Get on your hands and knees on a firm, lightly padded surface. Your hands should be directly below your shoulders, and your knees should be directly below your hips. Lift your left / right knee out to the side. Keep your knee bent. Do not twist your body. Hold this position for __________ seconds. Slowly lower your leg. Repeat __________  times. Complete this exercise __________ times a day. Straight leg raises, prone This exercise stretches the muscles that move the hips (hip extensors). Lie on your abdomen on a firm surface (prone position). Tense the muscles in your buttocks and lift your left / right leg about 4 inches (10 cm). Keep your knee straight as you lift your leg. If you cannot lift your leg that high without arching your back, place a pillow under your hips. Hold this position for __________ seconds. Slowly lower your leg to the starting position. Let your muscles relax completely after each repetition. Repeat __________ times. Complete this exercise __________ times a day. This information is not intended to replace advice given to you by your health care provider. Make sure you discuss any questions you have with your health care  provider. Document Revised: 08/31/2020 Document Reviewed: 08/31/2020 Elsevier Patient Education  2024 [employer].

## 2024-04-07 ENCOUNTER — Ambulatory Visit: Admitting: Physician Assistant

## 2024-04-16 ENCOUNTER — Encounter: Payer: Self-pay | Admitting: Physician Assistant

## 2024-04-18 MED ORDER — GABAPENTIN 100 MG PO CAPS: 100.0000 mg | ORAL_CAPSULE | Freq: Three times a day (TID) | ORAL | 0 refills | Status: AC
